# Patient Record
Sex: Male | Born: 1995
Health system: Southern US, Community
[De-identification: ages and names within clinical notes are randomized; demographics above are authoritative.]

## PROBLEM LIST (undated history)

## (undated) DIAGNOSIS — R519 Headache, unspecified: Secondary | ICD-10-CM

## (undated) DIAGNOSIS — R51 Headache: Secondary | ICD-10-CM

## (undated) DIAGNOSIS — K219 Gastro-esophageal reflux disease without esophagitis: Secondary | ICD-10-CM

---
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
## 2013-11-28 ENCOUNTER — Emergency Department: Payer: Self-pay | Admitting: Emergency Medicine

## 2014-03-03 ENCOUNTER — Emergency Department (HOSPITAL_COMMUNITY)
Admission: EM | Admit: 2014-03-03 | Discharge: 2014-03-04 | Disposition: A | Discharge status: Other Acute Inpatient Hospital | Payer: BC Managed Care – PPO | Attending: Emergency Medicine | Admitting: Emergency Medicine

## 2014-03-03 ENCOUNTER — Encounter (HOSPITAL_COMMUNITY): Payer: Self-pay | Admitting: Emergency Medicine

## 2014-03-03 DIAGNOSIS — R45850 Homicidal ideations: Secondary | ICD-10-CM | POA: Insufficient documentation

## 2014-03-03 DIAGNOSIS — R454 Irritability and anger: Secondary | ICD-10-CM

## 2014-03-03 DIAGNOSIS — F1210 Cannabis abuse, uncomplicated: Secondary | ICD-10-CM | POA: Insufficient documentation

## 2014-03-03 DIAGNOSIS — R45851 Suicidal ideations: Secondary | ICD-10-CM

## 2014-03-03 DIAGNOSIS — F911 Conduct disorder, childhood-onset type: Secondary | ICD-10-CM | POA: Insufficient documentation
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
## 2014-03-04 ENCOUNTER — Inpatient Hospital Stay (HOSPITAL_COMMUNITY): Admit: 2014-03-04 | Payer: Self-pay

## 2014-03-04 ENCOUNTER — Inpatient Hospital Stay (HOSPITAL_COMMUNITY)
Admission: AD | Admit: 2014-03-04 | Discharge: 2014-03-09 | DRG: 885 | Disposition: A | Discharge status: Home / Self Care | Payer: BC Managed Care – PPO | Source: Intra-hospital | Attending: Psychiatry | Admitting: Psychiatry

## 2014-03-04 ENCOUNTER — Emergency Department (HOSPITAL_COMMUNITY): Payer: BC Managed Care – PPO

## 2014-03-04 ENCOUNTER — Encounter (HOSPITAL_COMMUNITY): Payer: Self-pay | Admitting: Emergency Medicine

## 2014-03-04 ENCOUNTER — Encounter (HOSPITAL_COMMUNITY): Payer: Self-pay | Admitting: *Deleted

## 2014-03-04 DIAGNOSIS — F322 Major depressive disorder, single episode, severe without psychotic features: Secondary | ICD-10-CM

## 2014-03-04 DIAGNOSIS — R45850 Homicidal ideations: Secondary | ICD-10-CM

## 2014-03-04 DIAGNOSIS — F1994 Other psychoactive substance use, unspecified with psychoactive substance-induced mood disorder: Secondary | ICD-10-CM | POA: Diagnosis present

## 2014-03-04 DIAGNOSIS — F17200 Nicotine dependence, unspecified, uncomplicated: Secondary | ICD-10-CM | POA: Diagnosis present

## 2014-03-04 DIAGNOSIS — Z598 Other problems related to housing and economic circumstances: Secondary | ICD-10-CM

## 2014-03-04 DIAGNOSIS — F913 Oppositional defiant disorder: Secondary | ICD-10-CM | POA: Diagnosis present

## 2014-03-04 DIAGNOSIS — Z5987 Material hardship due to limited financial resources, not elsewhere classified: Secondary | ICD-10-CM

## 2014-03-04 DIAGNOSIS — F332 Major depressive disorder, recurrent severe without psychotic features: Principal | ICD-10-CM | POA: Diagnosis present

## 2014-03-04 DIAGNOSIS — F411 Generalized anxiety disorder: Secondary | ICD-10-CM | POA: Diagnosis present

## 2014-03-04 DIAGNOSIS — R45851 Suicidal ideations: Secondary | ICD-10-CM

## 2014-03-04 DIAGNOSIS — G4700 Insomnia, unspecified: Secondary | ICD-10-CM | POA: Diagnosis present

## 2014-03-04 LAB — CBC
HCT: 38.6 % — ABNORMAL LOW (ref 39.0–52.0)
Hemoglobin: 13.4 g/dL (ref 13.0–17.0)
MCH: 30.5 pg (ref 26.0–34.0)
MCHC: 34.7 g/dL (ref 30.0–36.0)
MCV: 87.7 fL (ref 78.0–100.0)
Platelets: 191 10*3/uL (ref 150–400)
RBC: 4.4 MIL/uL (ref 4.22–5.81)
RDW: 13.1 % (ref 11.5–15.5)
WBC: 7.3 10*3/uL (ref 4.0–10.5)

## 2014-03-04 LAB — RAPID URINE DRUG SCREEN, HOSP PERFORMED
AMPHETAMINES: NOT DETECTED
Barbiturates: NOT DETECTED
Benzodiazepines: NOT DETECTED
Cocaine: NOT DETECTED
OPIATES: NOT DETECTED
Tetrahydrocannabinol: POSITIVE — AB

## 2014-03-04 LAB — COMPREHENSIVE METABOLIC PANEL
ALT: 6 U/L (ref 0–53)
AST: 17 U/L (ref 0–37)
Albumin: 4.1 g/dL (ref 3.5–5.2)
Alkaline Phosphatase: 98 U/L (ref 39–117)
BUN: 8 mg/dL (ref 6–23)
CALCIUM: 9.2 mg/dL (ref 8.4–10.5)
CO2: 25 meq/L (ref 19–32)
Chloride: 104 mEq/L (ref 96–112)
Creatinine, Ser: 0.93 mg/dL (ref 0.50–1.35)
GFR calc Af Amer: >90 mL/min (ref >90)
Glucose, Bld: 95 mg/dL (ref 70–99)
Potassium: 4.1 mEq/L (ref 3.7–5.3)
Sodium: 141 mEq/L (ref 137–147)
Total Bilirubin: 0.4 mg/dL (ref 0.3–1.2)
Total Protein: 6.9 g/dL (ref 6.0–8.3)

## 2014-03-04 LAB — ETHANOL

## 2014-03-04 LAB — ACETAMINOPHEN LEVEL: Acetaminophen (Tylenol), Serum: <15 ug/mL (ref 10–30)

## 2014-03-04 LAB — SALICYLATE LEVEL: Salicylate Lvl: <2 mg/dL — ABNORMAL LOW (ref 2.8–20.0)

## 2014-03-04 MED ORDER — ZOLPIDEM TARTRATE 5 MG PO TABS: 5.0000 mg | ORAL_TABLET | Freq: Every evening | ORAL | Status: DC | PRN

## 2014-03-04 MED ORDER — LORAZEPAM 1 MG PO TABS: 1.0000 mg | ORAL_TABLET | Freq: Four times a day (QID) | ORAL | Status: DC | PRN

## 2014-03-04 MED ORDER — ACETAMINOPHEN 325 MG PO TABS: 650.0000 mg | ORAL_TABLET | ORAL | Status: DC | PRN

## 2014-03-04 MED ORDER — ONDANSETRON HCL 4 MG PO TABS: 4.0000 mg | ORAL_TABLET | Freq: Three times a day (TID) | ORAL | Status: DC | PRN

## 2014-03-04 MED ORDER — ACETAMINOPHEN 325 MG PO TABS: 650.0000 mg | ORAL_TABLET | Freq: Four times a day (QID) | ORAL | Status: DC | PRN

## 2014-03-04 MED ORDER — IBUPROFEN 400 MG PO TABS: 600.0000 mg | ORAL_TABLET | Freq: Three times a day (TID) | ORAL | Status: DC | PRN

## 2014-03-04 MED ORDER — HALOPERIDOL 1 MG PO TABS: 2.0000 mg | ORAL_TABLET | Freq: Four times a day (QID) | ORAL | Status: DC | PRN

## 2014-03-04 MED ORDER — MAGNESIUM HYDROXIDE 400 MG/5ML PO SUSP: 30.0000 mL | Freq: Every day | ORAL | Status: DC | PRN

## 2014-03-04 MED ORDER — LORAZEPAM 2 MG/ML IJ SOLN: 1.0000 mg | Freq: Four times a day (QID) | INTRAMUSCULAR | Status: DC | PRN

## 2014-03-04 MED ORDER — ALUM & MAG HYDROXIDE-SIMETH 200-200-20 MG/5ML PO SUSP: 30.0000 mL | ORAL | Status: DC | PRN

## 2014-03-04 MED ORDER — NICOTINE 21 MG/24HR TD PT24: 21.0000 mg | MEDICATED_PATCH | Freq: Every day | TRANSDERMAL | Status: DC

## 2014-03-04 MED ORDER — HYDROXYZINE HCL 25 MG PO TABS
25.0000 mg | ORAL_TABLET | Freq: Four times a day (QID) | ORAL | Status: DC | PRN
Start: 2014-03-04 — End: 2014-03-09
  Administered 2014-03-04 – 2014-03-08 (×5): 25 mg via ORAL
  Filled 2014-03-04 (×5): qty 1

## 2014-03-04 NOTE — ED Notes (Addendum)
Pt.'s mother spoke with Irving Burton, ACT Counselor.

## 2014-03-04 NOTE — Progress Notes (Signed)
Patient reports that he does not want to be here and patient did not want to stay in his room. Patient is in the quiet room laying down and reports that he can not be around people and he does not trust people. Patient was requesting a 72 hour discharge and when the paperwork was taken to him he did not sign and I informed him it will be on the front of his chart. Patient has been offered food and fluids and he did consume them. Charge RN Lupita Leash informed of patient status because he was alittle irritable but he was cooperating. PA Verne Spurr was also made ware of the status of the patient.

## 2014-03-04 NOTE — ED Notes (Signed)
Security called to wand patient 

## 2014-03-04 NOTE — ED Notes (Signed)
Pt requesting his left big toe to be examined because he dropped a skating boarding ramp on it. Security also at bedside to wand patient.

## 2014-03-04 NOTE — [facility] Group Notes (Signed)
[facility] LCSW Group Therapy      Emotional Regulation 1:15 - 2:30 PM            03/04/2014 2:48 PM  Type of Therapy:  Group Therapy  Participation Level:  Did Not Attend  Joesph July Mylin Hirano 03/04/2014, 2:48 PM

## 2014-03-04 NOTE — ED Notes (Signed)
Pt's mother here; visiting outside of normal hours approved by Interior and spatial designer. Security to wand.
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
## 2014-03-04 NOTE — Tx Team (Signed)
Initial Interdisciplinary Treatment Plan  PATIENT STRENGTHS: (choose at least two) Ability for insight Capable of independent living General fund of knowledge Physical Health  PATIENT STRESSORS: Marital or family conflict Substance abuse   PROBLEM LIST: Problem List/Patient Goals Date to be addressed Date deferred Reason deferred Estimated date of resolution  anxiety 03/04/2014     Suicidal ideations 03/04/2014                                                DISCHARGE CRITERIA:  Improved stabilization in mood, thinking, and/or behavior Need for constant or close observation no longer present Reduction of life-threatening or endangering symptoms to within safe limits  PRELIMINARY DISCHARGE PLAN: Outpatient therapy Placement in alternative living arrangements  PATIENT/FAMIILY INVOLVEMENT: This treatment plan has been presented to and reviewed with the patient, Khup Merriweather.  The patient and family have been given the opportunity to ask questions and make suggestions.  Barton Fanny 03/04/2014, 1:59 PM

## 2014-03-04 NOTE — BH Assessment (Signed)
[facility] Assessment Progress Note  Spoke with RN and set up tele assessment for 7:50 am.  Spoke with Dr. Gwendolyn Grant to inform him of assessment appt time and take history, but he did not see pt.

## 2014-03-04 NOTE — Progress Notes (Signed)
D: Patient in the hallway on approach.  Patient calm and cooperative with assessment.  Patient states he does not know what to expect by being here.  Patient states, "My mom put me here."  Patient became tearful when talking to Clinical research associate.  Patient denies SI/HI and denies AVH.    A: Staff to monitor Q 15 mins for safety.  Encouragement and support offered.  Scheduled medications administered per orders.  Vistaril admistered prn for sleep. R: Patient remains safe on the unit.  Patient attended group tonight.  Patient visible on the unit.  Patient taking administered medications.

## 2014-03-04 NOTE — Progress Notes (Signed)
Adult Psychoeducational Group Note  Date:  03/04/2014 Time:  10:56 PM  Group Topic/Focus:  Goals Group:   The focus of this group is to help patients establish daily goals to achieve during treatment and discuss how the patient can incorporate goal setting into their daily lives to aide in recovery.  Participation Level:  Active  Participation Quality:  Appropriate  Affect:  Appropriate  Cognitive:  Appropriate  Insight: Appropriate  Engagement in Group:  Engaged  Modes of Intervention:  Discussion  Additional Comments:  Pt stated that today was confusing but he wants to learn more about what's going on.  Aldona Lento 03/04/2014, 10:56 PM

## 2014-03-04 NOTE — ED Provider Notes (Signed)
Medical screening examination/treatment/procedure(s) were conducted as a shared visit with non-physician practitioner(s) and myself.  I personally evaluated the patient during the encounter.   EKG Interpretation None        Joya Gaskins, MD 03/04/14 [phone]

## 2014-03-04 NOTE — ED Notes (Signed)
Tele - psych being done by [facility].

## 2014-03-04 NOTE — ED Provider Notes (Signed)
Patient seen/examined in the Emergency Department in conjunction with Midlevel Provider [facility] Patient reports he is hopeless  With suicidal thoughts Exam : awake/alert, no distress noted Plan: awaiting telepsych evaluation    Joya Gaskins, MD 03/04/14 0345

## 2014-03-05 DIAGNOSIS — F329 Major depressive disorder, single episode, unspecified: Secondary | ICD-10-CM

## 2014-03-05 DIAGNOSIS — F1910 Other psychoactive substance abuse, uncomplicated: Secondary | ICD-10-CM

## 2014-03-05 MED ORDER — BUPROPION HCL ER (SR) 100 MG PO TB12
100.0000 mg | ORAL_TABLET | Freq: Every day | ORAL | Status: DC
  Administered 2014-03-05 – 2014-03-09 (×5): 100 mg via ORAL
  Filled 2014-03-05 (×7): qty 1
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
## 2014-03-05 NOTE — Progress Notes (Addendum)
D:  Patient's self inventory sheet, patient has fair sleep, good appetite, low energy level, poor attention span.  Rated depression 4, hopeless 1, anxiety 5.  Denied withdrawals.  Denied SI.  Denied physical problems.  Patient plans to discharge to mother's house.  No problems with meds after discharge, has insurance. A:  Medications administered per MD orders.  Emotional support and encouragement given patient. R:  Denied SI and HI, contracts for safety.  Denied A/V hallucinations.  Will continue to monitor patient for safety with 15 minute checks.  Safety maintained.  Parents want birthday cake to be given at 5:30 p.m. Tomorrow Friday.
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
## 2014-03-05 NOTE — Progress Notes (Signed)
Recreation Therapy Notes  Animal-Assisted Activity/Therapy (AAA/T) Program Checklist/Progress Notes Patient Eligibility Criteria Checklist & Daily Group note for Rec Tx Intervention  Date: 06.04.2015 Time: 2:45pm Location: [address] Dayroom    AAA/T Program Assumption of Risk Form signed by Patient/ or Parent Legal Guardian yes  Patient is free of allergies or sever asthma yes  Patient reports no fear of animals yes  Patient reports no history of cruelty to animals yes   Patient understands his/her participation is voluntary yes  Patient washes hands before animal contact yes  Patient washes hands after animal contact yes  Behavioral Response: Appropriate   Education: Hand Washing, Appropriate Animal Interaction   Education Outcome: Acknowledges understanding   Clinical Observations/Feedback: Patient interacted appropriately with pet therapy team and peers.  Hisayo Delossantos L Dyon Rotert, LRT/CTRS        Fernando Torry L Safa Derner 03/05/2014 5:08 PM 

## 2014-03-05 NOTE — Progress Notes (Signed)
The focus of this group is to educate the patient on the purpose and policies of crisis stabilization and provide a format to answer questions about their admission.  The group details unit policies and expectations of patients while admitted.  Patient did not attend 0900 nurse education orientation group this morning.  Patient stayed in bed sleeping.    

## 2014-03-05 NOTE — [facility] Group Notes (Signed)
[facility] LCSW Group Therapy  [facility] 1:15 - 2:30 PM  03/05/2014   Type of Therapy:  Group Therapy  Participation Level: Minimal  Participation Quality:  Attentive  Affect:  Appropriate  Cognitive:  Appropriate  Insight:  Developing/Improving   Engagement in Therapy:  Developing/Improving  Modes of Intervention:  Discussion, Education, Exploration, Problem-Solving, Rapport Building, Support   Summary of Progress/Problems:  Patient listened attentively to speaker from [facility] but made no comment on the presentation..   Sherriann Szuch Hairston 03/05/2014     

## 2014-03-05 NOTE — [facility] Suicide Risk Assessment (Signed)
[facility] INPATIENT:  Family/Significant Other Suicide Prevention Education  Suicide Prevention Education:  Education Completed; Anibal Peal, Mother, [phone];  has been identified by the patient as the family member/significant other with whom the patient will be residing, and identified as the person(s) who will aid the patient in the event of a mental health crisis (suicidal ideations/suicide attempt).  With written consent from the patient, the family member/significant other has been provided the following suicide prevention education, prior to the and/or following the discharge of the patient.  The suicide prevention education provided includes the following:  Suicide risk factors  Suicide prevention and interventions  National Suicide Hotline telephone number  [facility] assessment telephone number  [facility] Emergency Assistance 911  [facility] and/or Residential Mobile Crisis Unit telephone number  Request made of family/significant other to:  Remove weapons (e.g., guns, rifles, knives), all items previously/currently identified as safety concern. Mother advised guns will be secured prior to patient discharging.    Remove drugs/medications (over-the-counter, prescriptions, illicit drugs), all items previously/currently identified as a safety concern.  The family member/significant other verbalizes understanding of the suicide prevention education information provided.  The family member/significant other agrees to remove the items of safety concern listed above.  Chayanne Filippi Hairston Lauran Romanski 03/05/2014, 3:50 PM

## 2014-03-05 NOTE — Progress Notes (Signed)
Pt complained about sweating palms and feeling anxious when in [place].

## 2014-03-05 NOTE — [facility] Suicide Risk Assessment (Signed)
Suicide Risk Assessment  Admission Assessment     Nursing information obtained from:  Patient Demographic factors:  Male;Adolescent or young adult;Caucasian;Low socioeconomic status Current Mental Status:  Self-harm thoughts Loss Factors:  Loss of significant relationship Historical Factors:  Impulsivity Risk Reduction Factors:  Sense of responsibility to family Total Time spent with patient: 45 minutes  CLINICAL FACTORS:   Depression:   Aggression Anhedonia Hopelessness Impulsivity Insomnia Recent sense of peace/wellbeing Severe Unstable or Poor Therapeutic Relationship Previous Psychiatric Diagnoses and Treatments  Psychiatric Specialty Exam:     Blood pressure 122/76, pulse 45, temperature 97.3 F (36.3 C), temperature source Oral, resp. rate 16, height 5\' 9"  (1.753 m), weight 66.225 kg (146 lb).Body mass index is 21.55 kg/(m^2).  General Appearance: Guarded  Eye Contact::  Good  Speech:  Clear and Coherent  Volume:  Decreased  Mood:  Anxious, Depressed and Irritable  Affect:  Depressed and Flat  Thought Process:  Coherent and Goal Directed  Orientation:  Full (Time, Place, and Person)  Thought Content:  Rumination  Suicidal Thoughts:  Yes.  without intent/plan  Homicidal Thoughts:  No  Memory:  Immediate;   Good  Judgement:  Good  Insight:  Fair  Psychomotor Activity:  Decreased  Concentration:  Fair  Recall:  Good  Fund of Knowledge:Good  Language: Good  Akathisia:  NA  Handed:  Right  AIMS (if indicated):     Assets:  Communication Skills Desire for Improvement Intimacy Leisure Time Physical Health Resilience Social Support Talents/Skills Transportation  Sleep:  Number of Hours: 6.75   Musculoskeletal: Strength & Muscle Tone: within normal limits Gait & Station: normal Patient leans: N/A  COGNITIVE FEATURES THAT CONTRIBUTE TO RISK:  Polarized thinking    SUICIDE RISK:   Moderate:  Frequent suicidal ideation with limited intensity, and  duration, some specificity in terms of plans, no associated intent, good self-control, limited dysphoria/symptomatology, some risk factors present, and identifiable protective factors, including available and accessible social support.  PLAN OF CARE: Admit for depression and suicidal ideations.  I certify that inpatient services furnished can reasonably be expected to improve the patient's condition.  Randal Buba Timmya Blazier 03/05/2014, 1:37 PM

## 2014-03-05 NOTE — Progress Notes (Signed)
D: Pt denies SI/HI/AVH. Pt is pleasant and cooperative. Pt says he's getting better. Pt stated he wanted to get his GED, and then go to community college. Pt appears to have a understanding on what he needs to do. Pt stated he will change the way he associates with his friends, and possibly cut them loose if need be.   A: Pt was offered support and encouragement. Pt was given scheduled medications. Pt was encourage to attend groups. Q 15 minute checks were done for safety.   R:Pt attends groups and interacts well with peers and staff. Pt is taking medication. Pt has no complaints at this time.Pt receptive to treatment and safety maintained on unit.

## 2014-03-06 DIAGNOSIS — F913 Oppositional defiant disorder: Secondary | ICD-10-CM

## 2014-03-06 DIAGNOSIS — R45850 Homicidal ideations: Secondary | ICD-10-CM

## 2014-03-06 NOTE — Progress Notes (Signed)
Attended Group

## 2014-03-06 NOTE — Tx Team (Addendum)
Interdisciplinary Treatment Plan Update   Date Reviewed:  03/09/2014  Time Reviewed:  12:41 PM  Progress in Treatment:   Attending groups: Yes Participating in groups: Yes Taking medication as prescribed: Yes  Tolerating medication: Yes Family/Significant other contact made:  Yes, collateral contact with mother. Patient understands diagnosis: Yes  Discussing patient identified problems/goals with staff: Yes Medical problems stabilized or resolved: Yes Denies suicidal/homicidal ideation: Yes Patient has not harmed self or others: Yes  For review of initial/current patient goals, please see plan of care.  Estimated Length of Stay:  Discharge today  Reasons for Continued Hospitalization:    New Problems/Goals identified:    Discharge Plan or Barriers:   Home with outpatient follow up with [facility] and [facility]  Additional Comments:    Attendees:  Patient:  03/09/2014 12:41 PM   Signature:  Sallyanne Havers, MD 03/09/2014 12:41 PM  Signature:   03/09/2014 12:41 PM  Signature: Lamount Cranker, RN 03/09/2014 12:41 PM  Signature: Quintella Reichert, RN 03/09/2014 12:41 PM  Signature:  Lysbeth Penner, RN 03/09/2014 12:41 PM  Signature:  Juline Patch, LCSW 03/09/2014 12:41 PM  Signature:  Reyes Ivan, LCSW 03/09/2014 12:41 PM  Signature:  Leisa Lenz, Care Coordinator [facility] 03/09/2014 12:41 PM  Signature:   03/09/2014 12:41 PM  Signature: 03/09/2014  12:41 PM  Signature:   Onnie Boer, RN URCM 03/09/2014  12:41 PM  Signature: 03/09/2014  12:41 PM    Scribe for Treatment Team:   Juline Patch,  03/09/2014 12:41 PM

## 2014-03-06 NOTE — BHH Group Notes (Signed)
[facility] LCSW Aftercare Discharge Planning Group Note   03/06/2014 9:48 AM    Participation Quality:  Appropraite  Mood/Affect:  Appropriate  Depression Rating:  0  Anxiety Rating:  0  Thoughts of Suicide:  No  Will you contract for safety?   NA  Current AVH:  No  Plan for Discharge/Comments:  Patient attended discharge planning group and actively participated in group.  He will follow up with [facility] and Glendell Docker.  CSW provided all participants with daily workbook.   Transportation Means: Patient has transportation.   Supports:  Patient has a support system.   Omya Winfield, Joesph July

## 2014-03-06 NOTE — Progress Notes (Signed)
Gary Washington is OOB UAL on the 500 hall today..he tolerates this  Well.   A HE is cooperative, pleasant and easy to get along with. HE takes his emdications as ordered and he attends his groups as planned. He completes his morning self inventory and on it he writes he denies SI within the past 24 hrs, he rates his depression and hopelessness "0/0", respectively and says his DC plans are to " quit smoking and make more friends".   R Safety is in place and poc moves forward.

## 2014-03-06 NOTE — Progress Notes (Signed)
NUTRITION ASSESSMENT  Pt identified as at risk on the Malnutrition Screen Tool  INTERVENTION: 1. Educated patient on the importance of nutrition and encouraged intake of food and beverages.  Provided handout on healthy meal planning.  Recommended [place] of [employer].  Discussed IBT program at local farmer's market. 2. Discussed weight goals. 3. Supplements: none at this time.  NUTRITION DIAGNOSIS: Unintentional weight loss related to sub-optimal intake as evidenced by pt report.   Goal: Pt to meet >/= 90% of their estimated nutrition needs.  Monitor:  PO intake  Assessment:  Patient admitted with Major depression disorder, Oppositional defiant disorder, marijuana abuse.  Patient reports that he currently has an "OK" appetite with a decreased intake currently and a low intake for the past year secondary to being "lazy".    "I'm really not depressed but about 2-3 hours a day."  "just if something happens that bothers me"  "Sometimes I go months without feeling depression."  Patient states that he know he needs to eat better and plans on moving in with his mother and getting food stamps after d/c.  States he used to drink [employer] every morning.    18 y.o. male  Height: Ht Readings from Last 1 Encounters:  03/04/14 5\' 9"  (1.753 m) (42%*, Z = -0.21)   * Growth percentiles are based on CDC 2-20 Years data.    Weight: Wt Readings from Last 1 Encounters:  03/04/14 146 lb (66.225 kg) (35%*, Z = -0.38)   * Growth percentiles are based on CDC 2-20 Years data.    Weight Hx: Wt Readings from Last 10 Encounters:  03/04/14 146 lb (66.225 kg) (35%*, Z = -0.38)  03/03/14 147 lb 1 oz (66.707 kg) (37%*, Z = -0.33)   * Growth percentiles are based on CDC 2-20 Years data.    BMI:  Body mass index is 21.55 kg/(m^2). Pt meets criteria for normal weight based on current BMI.  Estimated Nutritional Needs: Kcal: 25-30 kcal/kg Protein: > 1 gram  protein/kg Fluid: 1 ml/kcal  Diet Order: General Pt is also offered choice of unit snacks mid-morning and mid-afternoon.  Pt is eating as desired.   Lab results and medications reviewed.   Oran Rein, RD, LDN Clinical Inpatient Dietitian Pager:  [phone] Weekend and after hours pager:  [phone]

## 2014-03-06 NOTE — Progress Notes (Signed)
[facility]  03/06/2014 12:42 PM Lynelle DoctorBrandon Highland  MRN:  [id number] Subjective: Patient continued to be depressed, anxious, hopeless, helpless, worthless, angry, impulsive and has a poor decision-making. Patient has low self-esteem and has a poor relationship with both mother and father. Patient also has a no good relationship with his boss will be worked up recently. Patient is known to have significant symptoms of anger outbursts and property destruction. Patient has been compliant with his medication and has not reported side effects. Patient has been learning coping skills by attending inpatient therapeutic activities.  Diagnosis:   DSM5: Schizophrenia Disorders:   Obsessive-Compulsive Disorders:   Trauma-Stressor Disorders:   Substance/Addictive Disorders:   Depressive Disorders:  Major Depressive Disorder - Severe (296.23) Total Time spent with patient: 30 minutes  Axis I: Major Depression, Recurrent severe and Oppositional Defiant Disorder  ADL's:  Impaired  Sleep: Fair  Appetite:  Fair  Suicidal Ideation:  Patient endorses suicidal ideation but contracts for safety while in the hospital Homicidal Ideation:  Patient stated he was upset with his mother and had a thoughtabout hurting her not homicidal thoughts AEB (as evidenced by):  Psychiatric Specialty Exam: Physical Exam  ROS  Blood pressure 121/81, pulse 42, temperature 97.3 F (36.3 C), temperature source Oral, resp. rate 16, height 5\' 9"  (1.753 m), weight 66.225 kg (146 lb).Body mass index is 21.55 kg/(m^2).  General Appearance: Guarded  Eye Contact::  Minimal  Speech:  Clear and Coherent and Slow  Volume:  Decreased  Mood:  Anxious, Depressed, Hopeless and Worthless  Affect:  Depressed and Flat  Thought Process:  Goal Directed and Intact  Orientation:  Full (Time, Place, and Person)  Thought Content:  Rumination  Suicidal Thoughts:  Yes.  with intent/plan  Homicidal Thoughts:  Yes.  without intent/plan   Memory:  Immediate;   Fair  Judgement:  Impaired  Insight:  Lacking  Psychomotor Activity:  Decreased  Concentration:  Fair  Recall:  [employer]  Fund of Knowledge:Fair  Language: Good  Akathisia:  NA  Handed:  Right  AIMS (if indicated):     Assets:  Communication Skills Desire for Improvement Financial Resources/Insurance Leisure Time Physical Health Resilience Social Support Talents/Skills  Sleep:  Number of Hours: 5.25   Musculoskeletal: Strength & Muscle Tone: within normal limits Gait & Station: normal Patient leans: N/A  Current Medications: Current Facility-Administered Medications  Medication Dose Route Frequency Provider Last Rate Last Dose  . acetaminophen (TYLENOL) tablet 650 mg  650 mg Oral Q6H PRN Beau FannyJohn C Withrow, FNP      . alum & mag hydroxide-simeth (MAALOX/MYLANTA) 200-200-20 MG/5ML suspension 30 mL  30 mL Oral Q4H PRN Beau FannyJohn C Withrow, FNP      . buPROPion [facility](WELLBUTRIN SR) 12 hr tablet 100 mg  100 mg Oral Daily Nehemiah SettleJanardhaha R Siham Bucaro, MD   100 mg at 03/06/14 1149  . hydrOXYzine (ATARAX/VISTARIL) tablet 25 mg  25 mg Oral Q6H PRN Beau FannyJohn C Withrow, FNP   25 mg at 03/05/14 2252  . LORazepam (ATIVAN) tablet 1 mg  1 mg Oral Q6H PRN Verne SpurrNeil Mashburn, PA-C       Or  . LORazepam (ATIVAN) injection 1 mg  1 mg Intramuscular Q6H PRN Verne SpurrNeil Mashburn, PA-C      . magnesium hydroxide (MILK OF MAGNESIA) suspension 30 mL  30 mL Oral Daily PRN Beau FannyJohn C Withrow, FNP        Lab Results: No results found for this or any previous visit (from the past 48 hour(s)).  Physical Findings: AIMS: Facial and Oral Movements Muscles of Facial Expression: None, normal Lips and Perioral Area: None, normal Jaw: None, normal Tongue: None, normal,Extremity Movements Upper (arms, wrists, hands, fingers): None, normal Lower (legs, knees, ankles, toes): None, normal, Trunk Movements Neck, shoulders, hips: None, normal, Overall Severity Severity of abnormal movements (highest score from questions  above): None, normal Incapacitation due to abnormal movements: None, normal Patient's awareness of abnormal movements (rate only patient's report): No Awareness, Dental Status Current problems with teeth and/or dentures?: No Does patient usually wear dentures?: No  CIWA:  CIWA-Ar Total: 1 COWS:  COWS Total Score: 2  Treatment Plan Summary: Daily contact with patient to assess and evaluate symptoms and progress in treatment Medication management  Plan: Treatment Plan/Recommendations:   1. Admit for crisis management and stabilization. 2. Medication management to reduce current symptoms to base line and improve the patient's overall level of functioning. Continue Wellbutrin SR 100 mg daily which can be titrated up to 300 mg as needed medically 3. Treat health problems as indicated. 4. Develop treatment plan to decrease risk of relapse upon discharge and to reduce the need for readmission. 5. Psycho-social education regarding relapse prevention and self care. 6. Health care follow up as needed for medical problems. 7. Restart home medications where appropriate. 8. disposition plans are in progress and patient may be discharged on Monday when he showed clinical improvement and contracts for safety.   Medical Decision Making Problem Points:  Established problem, worsening (2), New problem, with no additional work-up planned (3), Review of last therapy session (1) and Review of psycho-social stressors (1) Data Points:  Review or order clinical lab tests (1) Review or order medicine tests (1) Review of medication regiment & side effects (2) Review of new medications or change in dosage (2)  I certify that inpatient services furnished can reasonably be expected to improve the patient's condition.   Randal Buba Clee Pandit 03/06/2014, 12:42 PM

## 2014-03-07 DIAGNOSIS — F322 Major depressive disorder, single episode, severe without psychotic features: Secondary | ICD-10-CM

## 2014-03-07 DIAGNOSIS — R45851 Suicidal ideations: Secondary | ICD-10-CM

## 2014-03-07 NOTE — B[facility] Group Notes (Signed)
0900 nursing orientation group   The focus of this group is to educate the patient on the purpose and policies of crisis stabilization and provide a format to answer questions about their admission.  The group details unit policies and expectations of patients while admitted.  Pt was an active participant and appropriate and sharing.  He voiced no complaints.

## 2014-03-07 NOTE — [facility] Group Notes (Signed)
[facility] Group Notes:  (Clinical Social Work)  03/07/2014   3-4PM  Summary of Progress/Problems:   The main focus of today's process group was for the patient to identify ways in which they have sabotaged their own mental health wellness/recovery.  Motivational interviewing and a handout were used to explore the benefits and costs of their self-sabotaging behavior as well as the benefits and costs of changing this behavior.  The Stages of Change were explained to the group using a handout, and patients identified where they are with regard to changing self-defeating behaviors.  The patient was present for the first and last parts of group and was very vocal and engaged.  He was not present in the middle when people were sharing specifics about themselves.  He gave others good feedback.  Type of Therapy:  Process Group  Participation Level:  Active  Participation Quality:  Attentive and Supportive  Affect:  Blunted  Cognitive:  Alert and Oriented  Insight:  Engaged  Engagement in Therapy:  Engaged  Modes of Intervention:  Education, Motivational Interviewing   [employer], LCSW 03/07/2014, 4:00pm

## 2014-03-07 NOTE — Progress Notes (Signed)
Psychoeducational Group Note  Date: 03/07/2014 Time:  1015  Group Topic/Focus:  Identifying Needs:   The focus of this group is to help patients identify their personal needs that have been historically problematic and identify healthy behaviors to address their needs.  Participation Level:  Active  Participation Quality:  Appropriate  Affect:  Appropriate  Cognitive:  Oriented  Insight:  Improving  Engagement in Group:  Engaged  Additional Comments:  Pt was attentive and participated in the group. Shared good ideas.  Karinna Beadles A  

## 2014-03-07 NOTE — Progress Notes (Signed)
Pt has been up and active in the milieu today. He did shower this morning after breakfast.  He denies any depression or hopelessness but has anxiety a 2 on his self-inventory.  He denies any S/H ideation or A/V/H/. He reports his sleep is well and appetite improving and ability to pay attention as improving. He denies any physical problems.

## 2014-03-07 NOTE — Progress Notes (Signed)
Patient ID: Gary Washington, male   DOB: 05-01-1996, 18 y.o.   MRN: [id number] [facility] MD Progress Note  03/07/2014 3:32 PM Gary Washington  MRN:  [id number] Subjective: Patient is up and active in groups on the unit. He states he has hope now, has a different mind set, he reports that he now has some tools to use to help him cope with his anger. He is optimistic and pleasant. He states he is tired of being and feeling slow and states he plans to stop smoking weed now. He seems like an new person.  Objective: Master is alert and oriented today. He is focused and interested in what is going on around him. He is much less edgy and irritated. He is making plans to get his GED and thinks he may sit for the test to see if he can pass it out right.  Then if he fails he will take the class. If he passes he will think about college.  Diagnosis:   DSM5: Schizophrenia Disorders:   Obsessive-Compulsive Disorders:   Trauma-Stressor Disorders:   Substance/Addictive Disorders:   Depressive Disorders:  Major Depressive Disorder - Severe (296.23) Total Time spent with patient: 30 minutes  Axis I: Major Depression, Recurrent severe and Oppositional Defiant Disorder  ADL's:  Impaired  Sleep: Fair  Appetite:  Fair  Suicidal Ideation:  Patient endorses suicidal ideation but contracts for safety while in the hospital Homicidal Ideation:  Patient stated he was upset with his mother and had a thoughtabout hurting her not homicidal thoughts AEB (as evidenced by):  Psychiatric Specialty Exam: Physical Exam  ROS  Blood pressure 110/74, pulse 51, temperature 97.3 F (36.3 C), temperature source Oral, resp. rate 16, height 5\' 9"  (1.753 m), weight 66.225 kg (146 lb).Body mass index is 21.55 kg/(m^2).  General Appearance: Guarded  Eye Contact::  Minimal  Speech:  Clear and Coherent and Slow  Volume:  Decreased  Mood:  Anxious, Depressed, Hopeless and Worthless  Affect:  Depressed and Flat  Thought Process:  Goal  Directed and Intact  Orientation:  Full (Time, Place, and Person)  Thought Content:  Rumination  Suicidal Thoughts:  Yes.  with intent/plan  Homicidal Thoughts:  Yes.  without intent/plan  Memory:  Immediate;   Fair  Judgement:  Impaired  Insight:  Lacking  Psychomotor Activity:  Decreased  Concentration:  Fair  Recall:  [employer] of Knowledge:Fair  Language: Good  Akathisia:  NA  Handed:  Right  AIMS (if indicated):     Assets:  Communication Skills Desire for Improvement Financial Resources/Insurance Leisure Time Physical Health Resilience Social Support Talents/Skills  Sleep:  Number of Hours: 6   Musculoskeletal: Strength & Muscle Tone: within normal limits Gait & Station: normal Patient leans: N/A  Current Medications: Current Facility-Administered Medications  Medication Dose Route Frequency Provider Last Rate Last Dose  . acetaminophen (TYLENOL) tablet 650 mg  650 mg Oral Q6H PRN Beau Fanny, FNP      . alum & mag hydroxide-simeth (MAALOX/MYLANTA) 200-200-20 MG/5ML suspension 30 mL  30 mL Oral Q4H PRN Beau Fanny, FNP      . buPROPion [facility] SR) 12 hr tablet 100 mg  100 mg Oral Daily Nehemiah Settle, MD   100 mg at 03/07/14 0852  . hydrOXYzine (ATARAX/VISTARIL) tablet 25 mg  25 mg Oral Q6H PRN Beau Fanny, FNP   25 mg at 03/06/14 2122  . LORazepam (ATIVAN) tablet 1 mg  1 mg Oral Q6H PRN Verne Spurr,  PA-C       Or  . LORazepam (ATIVAN) injection 1 mg  1 mg Intramuscular Q6H PRN Verne SpurrNeil Terryn Redner, PA-C      . magnesium hydroxide (MILK OF MAGNESIA) suspension 30 mL  30 mL Oral Daily PRN Beau FannyJohn C Withrow, FNP        Lab Results: No results found for this or any previous visit (from the past 48 hour(s)).  Physical Findings: AIMS: Facial and Oral Movements Muscles of Facial Expression: None, normal Lips and Perioral Area: None, normal Jaw: None, normal Tongue: None, normal,Extremity Movements Upper (arms, wrists, hands, fingers): None,  normal Lower (legs, knees, ankles, toes): None, normal, Trunk Movements Neck, shoulders, hips: None, normal, Overall Severity Severity of abnormal movements (highest score from questions above): None, normal Incapacitation due to abnormal movements: None, normal Patient's awareness of abnormal movements (rate only patient's report): No Awareness, Dental Status Current problems with teeth and/or dentures?: No Does patient usually wear dentures?: No  CIWA:  CIWA-Ar Total: 1 COWS:  COWS Total Score: 2  Treatment Plan Summary: Daily contact with patient to assess and evaluate symptoms and progress in treatment Medication management  Plan: Treatment Plan/Recommendations:   1.Continue crisis management and stabilization. 2. Medication management to reduce current symptoms to base line and improve the patient's overall level of functioning. Continue Wellbutrin SR 100 mg daily which can be titrated up to 300 mg as needed medically 3. Treat health problems as indicated. 4. Develop treatment plan to decrease risk of relapse upon discharge and to reduce the need for readmission. 5. Psycho-social education regarding relapse prevention and self care. 6. Health care follow up as needed for medical problems. 7. Restart home medications where appropriate. 8. disposition plans are in progress and patient may be discharged on Monday when he showed clinical improvement and contracts for safety. 9. Anticipating D/C on Monday.  Medical Decision Making Problem Points:  Established problem, worsening (2), New problem, with no additional work-up planned (3), Review of last therapy session (1) and Review of psycho-social stressors (1) Data Points:  Review or order clinical lab tests (1) Review or order medicine tests (1) Review of medication regiment & side effects (2) Review of new medications or change in dosage (2)  I certify that inpatient services furnished can reasonably be expected to improve the  patient's condition.  Rona RavensNeil T. Rithy Mandley RPAC 3:56 PM 03/07/2014

## 2014-03-07 NOTE — Progress Notes (Signed)
Adult Psychoeducational Group Note  Date:  03/07/2014 Time:  11:38 PM  Group Topic/Focus:  Wrap-Up Group:   The focus of this group is to help patients review their daily goal of treatment and discuss progress on daily workbooks.  Participation Level:  Active  Participation Quality:  Appropriate  Affect:  Appropriate  Cognitive:  Appropriate  Insight: Appropriate  Engagement in Group:  Engaged  Modes of Intervention:  Discussion  Additional Comments:  The patient expressed  that he had a happy day.The patient said that he was able to laugh and joke today.  Laneta Simmers Laquinda Moller 03/07/2014, 11:38 PM

## 2014-03-07 NOTE — Progress Notes (Signed)
D: Pt denies SI/HI/AVH. Pt is pleasant and cooperative. Pt stated he learned a lot about himself , more here than any other place.   A: Pt was offered support and encouragement. Pt was given scheduled medications. Pt was encourage to attend groups. Q 15 minute checks were done for safety.   R:Pt attends groups and interacts well with peers and staff. Pt is taking medication. Pt has no complaints at this time.Pt receptive to treatment and safety maintained on unit.

## 2014-03-08 NOTE — B[facility] Group Notes (Signed)
[facility] Group Notes:  (Clinical Social Work)  03/08/2014   1:15-2:15PM  Summary of Progress/Problems:  The main focus of today's process group was to   identify the patient's current support system and decide on other supports that can be put in place.  The picture on workbook was used to discuss why additional supports are needed.  An emphasis was placed on using counselor, doctor, therapy groups, 12-step groups, and problem-specific support groups to expand supports.   There was also an extensive discussion about what constitutes a healthy support versus an unhealthy support.  The patient expressed full comprehension of the concepts presented, and agreed that there is a need to add more supports.  The patient that he does not really like groups because he has ADD and it is very hard for him to stay focused.  CSW urged him to stand and sit quietly as needed to ease this problem.  He did not stand any during group, and had good eye contact and focus throughout.  Type of Therapy:  Process Group  Participation Level:  Active  Participation Quality:  Attentive and Sharing  Affect:  Blunted  Cognitive:  Appropriate and Oriented  Insight:  Engaged  Engagement in Therapy:  Engaged  Modes of Intervention:  Education,  Support and [employer], LCSW 03/08/2014, 4:00pm

## 2014-03-08 NOTE — Progress Notes (Signed)
Psychoeducational Group Note  Date: 03/08/2014 Time: 1015  Group Topic/Focus:  Making Healthy Choices:   The focus of this group is to help patients identify negative/unhealthy choices they were using prior to admission and identify positive/healthier coping strategies to replace them upon discharge.  Participation Level:  Active  Participation Quality:  Appropriate  Affect:  Appropriate  Cognitive:  Oriented  Insight:  Engaged  Engagement in Group:  Engaged  Additional Comments:    03/08/2014,4:56 PM Joie Bimler Muriel Hannold

## 2014-03-08 NOTE — Progress Notes (Signed)
Patient ID: Gary Washington, male   DOB: [birth year], 18 y.o.   MRN: [id number] D: Pt. Lying in bed, eyes closed, respirations even. A: Writer observed for s/s of distress. Staff will monitor q37min for safety. R: Pt. Is safe on the unit, no distress noted, respirations even, unlabored.

## 2014-03-08 NOTE — Progress Notes (Signed)
Pt has been up and active in the milieu today.  He rated his depression, hopelessness and anxiety a 1 on his self-inventory.  He denies any S/H ideation or A/V/H.  Voiced no complaints thus far.  Plans to f/u with Dr. Tomasa Rand.

## 2014-03-08 NOTE — B[facility] Group Notes (Signed)
0900 nursing orientation group  The focus of this group is to educate the patient on the purpose and policies of crisis stabilization and provide a format to answer questions about their admission.  The group details unit policies and expectations of patients while admitted.   Pt was appropriate and sharing in group.  His mother is the "one person I can count on" "I need to give myself credit for the things I do"

## 2014-03-08 NOTE — Progress Notes (Signed)
Patient ID: Gary Washington, male   DOB: Jun 20, 1996, 18 y.o.   MRN: [id number] [facility] MD Progress Note  03/08/2014 5:13 PM Gary Washington  MRN:  [id number] Subjective: Continues to do well today. Mood continues to improve, patient is doing well and has no complaints.  He is anticipating discharge to home in the AM as Dr. Shela Commons "promised him."  He denies SIHI/ or AVH. He is taking his meds and attending groups. He is doing well. Objective: Gary Washington is alert and oriented today. He is focused and interested in what is going on around him. He is much less edgy and irritated. He is making plans to get his GED and thinks he may sit for the test to see if he can pass it out right.  Then if he fails he will take the class. If he passes he will think about college.  Diagnosis:   DSM5: Schizophrenia Disorders:   Obsessive-Compulsive Disorders:   Trauma-Stressor Disorders:   Substance/Addictive Disorders:   Depressive Disorders:  Major Depressive Disorder - Severe (296.23) Total Time spent with patient: 30 minutes  Axis I: Major Depression, Recurrent severe and Oppositional Defiant Disorder  ADL's:  Impaired  Sleep: Fair  Appetite:  Fair  Suicidal Ideation:  Patient endorses suicidal ideation but contracts for safety while in the hospital Homicidal Ideation:  Patient stated he was upset with his mother and had a thoughtabout hurting her not homicidal thoughts AEB (as evidenced by):  Psychiatric Specialty Exam: Physical Exam  ROS  Blood pressure 117/69, pulse 54, temperature 97.3 F (36.3 C), temperature source Oral, resp. rate 17, height 5\' 9"  (1.753 m), weight 66.225 kg (146 lb).Body mass index is 21.55 kg/(m^2).  General Appearance: Guarded  Eye Contact::  Minimal  Speech:  Clear and Coherent and Slow  Volume:  Decreased  Mood:  Anxious, Depressed, Hopeless and Worthless  Affect:  Depressed and Flat  Thought Process:  Goal Directed and Intact  Orientation:  Full (Time, Place, and Person)  Thought  Content:  Rumination  Suicidal Thoughts:  Yes.  with intent/plan  Homicidal Thoughts:  Yes.  without intent/plan  Memory:  Immediate;   Fair  Judgement:  Impaired  Insight:  Lacking  Psychomotor Activity:  Decreased  Concentration:  Fair  Recall:  [employer] of Knowledge:Fair  Language: Good  Akathisia:  NA  Handed:  Right  AIMS (if indicated):     Assets:  Communication Skills Desire for Improvement Financial Resources/Insurance Leisure Time Physical Health Resilience Social Support Talents/Skills  Sleep:  Number of Hours: 4.5   Musculoskeletal: Strength & Muscle Tone: within normal limits Gait & Station: normal Patient leans: N/A  Current Medications: Current Facility-Administered Medications  Medication Dose Route Frequency Provider Last Rate Last Dose  . acetaminophen (TYLENOL) tablet 650 mg  650 mg Oral Q6H PRN Beau Fanny, FNP      . alum & mag hydroxide-simeth (MAALOX/MYLANTA) 200-200-20 MG/5ML suspension 30 mL  30 mL Oral Q4H PRN Beau Fanny, FNP      . buPROPion [facility] SR) 12 hr tablet 100 mg  100 mg Oral Daily Nehemiah Settle, MD   100 mg at 03/07/14 0852  . hydrOXYzine (ATARAX/VISTARIL) tablet 25 mg  25 mg Oral Q6H PRN Beau Fanny, FNP   25 mg at 03/06/14 2122  . LORazepam (ATIVAN) tablet 1 mg  1 mg Oral Q6H PRN Verne Spurr, PA-C       Or  . LORazepam (ATIVAN) injection 1 mg  1 mg Intramuscular  Q6H PRN Verne SpurrNeil Ilithyia Titzer, PA-C      . magnesium hydroxide (MILK OF MAGNESIA) suspension 30 mL  30 mL Oral Daily PRN Beau FannyJohn C Withrow, FNP        Lab Results: No results found for this or any previous visit (from the past 48 hour(s)).  Physical Findings: AIMS: Facial and Oral Movements Muscles of Facial Expression: None, normal Lips and Perioral Area: None, normal Jaw: None, normal Tongue: None, normal,Extremity Movements Upper (arms, wrists, hands, fingers): None, normal Lower (legs, knees, ankles, toes): None, normal, Trunk Movements Neck,  shoulders, hips: None, normal, Overall Severity Severity of abnormal movements (highest score from questions above): None, normal Incapacitation due to abnormal movements: None, normal Patient's awareness of abnormal movements (rate only patient's report): No Awareness, Dental Status Current problems with teeth and/or dentures?: No Does patient usually wear dentures?: No  CIWA:  CIWA-Ar Total: 1 COWS:  COWS Total Score: 2  Treatment Plan Summary: Daily contact with patient to assess and evaluate symptoms and progress in treatment Medication management  Plan: Treatment Plan/Recommendations:   1.Continue crisis management and stabilization. 2. Medication management to reduce current symptoms to base line and improve the patient's overall level of functioning. Continue Wellbutrin SR 100 mg daily which can be titrated up to 300 mg as needed medically 3. Treat health problems as indicated. 4. Develop treatment plan to decrease risk of relapse upon discharge and to reduce the need for readmission. 5. Psycho-social education regarding relapse prevention and self care. 6. Health care follow up as needed for medical problems. 7. Restart home medications where appropriate. 8. disposition plans are in progress and patient may be discharged on Monday when he showed clinical improvement and contracts for safety. 9. Anticipating D/C on Monday.  Medical Decision Making Problem Points:  Established problem, worsening (2), New problem, with no additional work-up planned (3), Review of last therapy session (1) and Review of psycho-social stressors (1) Data Points:  Review or order clinical lab tests (1) Review or order medicine tests (1) Review of medication regiment & side effects (2) Review of new medications or change in dosage (2)  I certify that inpatient services furnished can reasonably be expected to improve the patient's condition.  Rona RavensNeil T. Broghan Pannone RPAC 5:13 PM 03/08/2014

## 2014-03-09 MED ORDER — BUPROPION HCL ER (SR) 100 MG PO TB12: 100.0000 mg | ORAL_TABLET | Freq: Every day | ORAL | Status: DC

## 2014-03-09 NOTE — Progress Notes (Signed)
  D: Pt observed sleeping in bed with eyes closed. RR even and unlabored. No distress noted  .  A: Q 15 minute checks were done for safety.  R: safety maintained on unit.  

## 2014-03-09 NOTE — [facility] Suicide Risk Assessment (Signed)
   Demographic Factors:  Adolescent or young adult and Caucasian  Total Time spent with patient: 45 minutes  Psychiatric Specialty Exam: Physical Exam  ROS  Blood pressure 124/85, pulse 67, temperature 96.7 F (35.9 C), temperature source Oral, resp. rate 16, height 5\' 9"  (1.753 m), weight 66.225 kg (146 lb).Body mass index is 21.55 kg/(m^2).  General Appearance: Well Groomed  Patent attorney::  Good  Speech:  Normal Rate  Volume:  Normal  Mood:  Euthymic and improved compared to admission  Affect:  Appropriate and reactive  Thought Process:  Goal Directed  Orientation:  Full (Time, Place, and Person)  Thought Content:  no psychotic symptoms   Suicidal Thoughts:  No  Homicidal Thoughts:  No  Memory:  Negative  Judgement:  Good  Insight:  Fair  Psychomotor Activity:  Normal  Concentration:  Good  Recall:  Good  Fund of Knowledge:Good  Language: Good  Akathisia:  No  Handed:  Right  AIMS (if indicated):     Assets:  Communication Skills Desire for Improvement Social Support Talents/Skills  Sleep:  Number of Hours: 4.75    Musculoskeletal: Strength & Muscle Tone: within normal limits Gait & Station: normal Patient leans: Right and N/A   Mental Status Per Nursing Assessment::   On Admission:  Self-harm thoughts  Current Mental Status by Physician: At this time patient not suicidal , not homicidal , not psychotic , future oriented, no psychotic symptoms  Loss Factors: recently evicted, and mother did not want to let him return home to live if  active substance abuse   Historical Factors: Impulsivity  Risk Reduction Factors:   Positive social support and has a passion for skateboarding  Continued Clinical Symptoms:  Depression:   Anhedonia Alcohol/Substance Abuse/Dependencies  Cognitive Features That Contribute To Risk:  Closed-mindedness    Suicide Risk:  Mild:  Suicidal ideation of limited frequency, intensity, duration, and specificity.  There are no  identifiable plans, no associated intent, mild dysphoria and related symptoms, good self-control (both objective and subjective assessment), few other risk factors, and identifiable protective factors, including available and accessible social support.  Discharge Diagnoses:  AXIS I: Major Depression, single episode and Substance Abuse  AXIS II: Deferred  AXIS III: History reviewed. No pertinent past medical history.  AXIS IV: economic problems, housing problems, other psychosocial or environmental problems, problems related to social environment and problems with primary support group AXIS V: 65 currently    Plan Of Care/Follow-up recommendations:  Activity:  as tolerated  Diet:  regular Tests:  NA Other:  Will follow up as outpatient for ongoing medication management Glendell Docker). We have also discussed the importance of cannabis abstinence/ cessation and I have encouraged 12 step prograM ( NA) participation.  Patient states he is motivated in not using any drugs ( cannabis is substance of choice), which was a condition his mother stipulated for him to be able to return home) . He plans to stay away from people, places and situations he associates with drug use.   Is patient on multiple antipsychotic therapies at discharge:  No   Has Patient had three or more failed trials of antipsychotic monotherapy by history:  No  Recommended Plan for Multiple Antipsychotic Therapies: NA    Fernando Cobos 03/09/2014, 2:23 PM

## 2014-03-09 NOTE — Progress Notes (Signed)
D) Pt being discharged to home. Mood and affect are appropriate. Pt denies SI and HI, delusions and hallucinations. Pt rates his depression and hopelessness both at a 0. A) All belongings returned to Pt along with all his discharge plans and medications explained to Pt. Pt given support, reassurance and praise, along with encouragement. R) Pt denies SI and HI

## 2014-03-09 NOTE — Progress Notes (Signed)
[facility] Adult Case Management Discharge Plan :  Will you be returning to the same living situation after discharge: Yes,  Patient is returning to his mother's home. At discharge, do you have transportation home?:Yes,  Patient to arrange transportation home. Do you have the ability to pay for your medications:Yes,  Patient is able to obtain medications.  Release of information consent forms completed and in the chart;  Patient's signature needed at discharge.  Patient to Follow up at: Follow-up Information   Follow up with Glendell Docker On 03/16/2014. (Monday, March 16, 2014 at 11 A<)    Contact information:   [address] [place], [place]   [address]  [phone]      Follow up with Dr. Lolly Mustache - [facility] On 04/09/2014. (Thursday, April 09, 2013 at [facility].  Please arrive at 8:30 and bring completed registration form)    Contact information:   [address] [place], [place]   [address]  [phone]      Patient denies SI/HI:   Patient no longer endorsing SI/HI or other thoughts of self harm.   Safety Planning and Suicide Prevention discussed:  .Reviewed with all patients during discharge planning group   Druscilla Petsch Hairston Heaven Meeker 03/09/2014, 1:04 PM

## 2014-03-09 NOTE — [facility] Group Notes (Signed)
[facility] LCSW Aftercare Discharge Planning Group Note   03/09/2014 9:25 AM    Participation Quality:  Appropraite  Mood/Affect:  Appropriate  Depression Rating:  1  Anxiety Rating:  1  Thoughts of Suicide:  No  Will you contract for safety?   NA  Current AVH:  No  Plan for Discharge/Comments:  Patient attended discharge planning group and actively participated in group.  He will follow up with [facility] and Glendell Docker.  CSW provided all participants with daily workbook.   Transportation Means: Patient has transportation.   Supports:  Patient has a support system.   Shanicka Oldenkamp, Joesph July

## 2014-03-13 NOTE — Progress Notes (Signed)
Patient Discharge Instructions:  After Visit Summary (AVS):   Faxed to:  03/13/14 Discharge Summary Note:   Faxed to:  03/13/14 Psychiatric Admission Assessment Note:   Faxed to:  03/13/14 Suicide Risk Assessment - Discharge Assessment:   Faxed to:  03/13/14 Faxed/Sent to the Next Level Care provider:  03/13/14 Next Level Care Provider Has Access to the EMR, 03/13/14 Faxed to Glendell DockerMatthew McMillan @ [phone] [facility] via [facility]/Epic access.  Jerelene ReddenSheena E [place], 03/13/2014, 11:25 AM
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
## 2014-03-26 NOTE — Discharge Summary (Signed)
Patient seen, Suicide Assessment Completed.  Disposition Plan Reviewed  

## 2014-04-09 ENCOUNTER — Ambulatory Visit (HOSPITAL_COMMUNITY): Payer: Self-pay | Admitting: Psychiatry

## 2014-12-23 ENCOUNTER — Ambulatory Visit: Payer: Self-pay | Admitting: Pediatrics

## 2015-08-07 ENCOUNTER — Emergency Department
Admission: EM | Admit: 2015-08-07 | Discharge: 2015-08-07 | Disposition: A | Discharge status: Home / Self Care | Payer: 59 | Attending: Emergency Medicine | Admitting: Emergency Medicine

## 2015-08-07 ENCOUNTER — Encounter: Payer: Self-pay | Admitting: Emergency Medicine

## 2015-08-07 DIAGNOSIS — K529 Noninfective gastroenteritis and colitis, unspecified: Secondary | ICD-10-CM | POA: Insufficient documentation

## 2015-08-07 DIAGNOSIS — Z720 Tobacco use: Secondary | ICD-10-CM | POA: Insufficient documentation

## 2015-08-07 DIAGNOSIS — Z79899 Other long term (current) drug therapy: Secondary | ICD-10-CM | POA: Diagnosis not present

## 2015-08-07 DIAGNOSIS — R112 Nausea with vomiting, unspecified: Secondary | ICD-10-CM | POA: Diagnosis present

## 2015-08-07 MED ORDER — ONDANSETRON HCL 4 MG/2ML IJ SOLN
4.0000 mg | Freq: Once | INTRAMUSCULAR | Status: AC
  Administered 2015-08-07: 4 mg via INTRAVENOUS
  Filled 2015-08-07: qty 2

## 2015-08-07 MED ORDER — SODIUM CHLORIDE 0.9 % IV BOLUS (SEPSIS)
1000.0000 mL | Freq: Once | INTRAVENOUS | Status: AC
Start: 2015-08-07 — End: 2015-08-07
  Administered 2015-08-07: 1000 mL via INTRAVENOUS

## 2015-08-07 MED ORDER — ONDANSETRON HCL 4 MG PO TABS: 4.0000 mg | ORAL_TABLET | Freq: Three times a day (TID) | ORAL | Status: AC | PRN

## 2015-08-07 NOTE — ED Notes (Signed)
Dehydration, vomiting x1 day

## 2015-08-07 NOTE — ED Notes (Signed)
Pt to ed with c/o vomiting and diarrhea that started about 6 am today.  Pt denies abd pain.  Pt states vomited x 2 since 6 am.

## 2015-08-07 NOTE — ED Provider Notes (Signed)
Time Seen: Approximately. ----------------------------------------- 9:37 AM on 08/07/2015 -----------------------------------------     I have reviewed the triage notes  Chief Complaint: No chief complaint on file.   History of Present Illness: Gary Washington is a 19 y.o. male who presents with acute onset this morning of nausea, vomiting, loose stool. Patient describes no melena or hematochezia, no hematemesis or biliary emesis. The patient states he still feels nauseated and after vomiting a couple times had some dry heaves. He describes some mild diffuse abdominal cramping. Patient states the abdominal cramping started after the nausea and the diarrhea. He describes it as loose watery stool without any melena or hematochezia. He has not been on any recent antibiotics, denies travel,. He states he last ate at [employer] last night at 1 AM. He is not aware of anyone else having any issues from the restaurant. He denies any fever at home.   History reviewed. No pertinent past medical history.  Patient Active Problem List   Diagnosis Date Noted  . Suicidal ideation 03/04/2014    History reviewed. No pertinent past surgical history.  History reviewed. No pertinent past surgical history.  Current Outpatient Rx  Name  Route  Sig  Dispense  Refill  . buPROPion (WELLBUTRIN SR) 100 MG 12 hr tablet   Oral   Take 1 tablet (100 mg total) by mouth daily.   30 tablet   0     Allergies:  Review of patient's allergies indicates no known allergies.  Family History: History reviewed. No pertinent family history.  Social History: Social History  Substance Use Topics  . Smoking status: Current Every Day Smoker  . Smokeless tobacco: None  . Alcohol Use: Yes     Review of Systems:   10 point review of systems was performed and was otherwise negative:  Constitutional: No fever Eyes: No visual disturbances ENT: No sore throat, ear pain Cardiac: No chest pain Respiratory: No  shortness of breath, wheezing, or stridor Abdomen: Diffuse abdominal pain, no active vomiting Endocrine: No weight loss, No night sweats Extremities: No peripheral edema, cyanosis Skin: No rashes, easy bruising Neurologic: No focal weakness, trouble with speech or swollowing Urologic: No dysuria, Hematuria, or urinary frequency   Physical Exam:  ED Triage Vitals  Enc Vitals Group     BP 08/07/15 0917 133/66 mmHg     Pulse Rate 08/07/15 0917 61     Resp 08/07/15 0917 20     Temp 08/07/15 0917 97.6 F (36.4 C)     Temp Source 08/07/15 0917 Oral     SpO2 08/07/15 0917 100 %     Weight 08/07/15 0917 146 lb (66.225 kg)     Height 08/07/15 0917 5\' 10"  (1.778 m)     Head Cir --      Peak Flow --      Pain Score 08/07/15 0918 2     Pain Loc --      Pain Edu? --      Excl. in GC? --     General: Awake , Alert , and Oriented times 3; GCS 15 Head: Normal cephalic , atraumatic Eyes: Pupils equal , round, reactive to light Nose/Throat: No nasal drainage, patent upper airway without erythema or exudate. Moist mucous membranes  Neck: Supple, Full range of motion, No anterior adenopathy or palpable thyroid masses Lungs: Clear to ascultation without wheezes , rhonchi, or rales Heart: Regular rate, regular rhythm without murmurs , gallops , or rubs Abdomen: Soft, non tender without  rebound, guarding , or rigidity; bowel sounds positive and symmetric in all 4 quadrants. No organomegaly .        Extremities: 2 plus symmetric pulses. No edema, clubbing or cyanosis Neurologic: normal ambulation, Motor symmetric without deficits, sensory intact Skin: warm, dry, no rashes     ED Course:  Repeat exam of the abdomen showed no peritoneal signs. Patient will be started on outpatient Zofran just for a couple of days and had IV Zofran here in emergency department tolerated it well. Patient was advised to advance his diet as tolerated and drink plenty of fluids. Return here especially if he has  hematemesis, bloody diarrhea, high fever, focal abdominal pain, or any other new concerns.   Assessment:  Acute viral gastroenteritis     Plan: Outpatient management Patient was advised to return immediately if condition worsens. Patient was advised to follow up with her primary care physician or other specialized physicians involved and in their current assessment.             Jennye Moccasin, MD 08/07/15 [phone]

## 2015-09-06 ENCOUNTER — Encounter (HOSPITAL_COMMUNITY): Payer: Self-pay | Admitting: Family Medicine

## 2015-09-06 ENCOUNTER — Emergency Department (HOSPITAL_COMMUNITY)
Admission: EM | Admit: 2015-09-06 | Discharge: 2015-09-06 | Disposition: A | Discharge status: Home / Self Care | Payer: 59 | Attending: Emergency Medicine | Admitting: Emergency Medicine

## 2015-09-06 DIAGNOSIS — F17200 Nicotine dependence, unspecified, uncomplicated: Secondary | ICD-10-CM | POA: Diagnosis not present

## 2015-09-06 DIAGNOSIS — Z79899 Other long term (current) drug therapy: Secondary | ICD-10-CM | POA: Diagnosis not present

## 2015-09-06 DIAGNOSIS — F1210 Cannabis abuse, uncomplicated: Secondary | ICD-10-CM | POA: Diagnosis not present

## 2015-09-06 DIAGNOSIS — F419 Anxiety disorder, unspecified: Secondary | ICD-10-CM | POA: Diagnosis present

## 2015-09-06 LAB — CBC WITH DIFFERENTIAL/PLATELET
BASOS ABS: 0 10*3/uL (ref 0.0–0.1)
Basophils Relative: 1 %
EOS PCT: 2 %
Eosinophils Absolute: 0.1 10*3/uL (ref 0.0–0.7)
HCT: 44.9 % (ref 39.0–52.0)
Hemoglobin: 14.9 g/dL (ref 13.0–17.0)
LYMPHS ABS: 2.3 10*3/uL (ref 0.7–4.0)
Lymphocytes Relative: 33 %
MCH: 29.4 pg (ref 26.0–34.0)
MCHC: 33.2 g/dL (ref 30.0–36.0)
MCV: 88.7 fL (ref 78.0–100.0)
MONO ABS: 0.4 10*3/uL (ref 0.1–1.0)
Monocytes Relative: 6 %
Neutro Abs: 4 10*3/uL (ref 1.7–7.7)
Neutrophils Relative %: 58 %
PLATELETS: 209 10*3/uL (ref 150–400)
RBC: 5.06 MIL/uL (ref 4.22–5.81)
RDW: 12.9 % (ref 11.5–15.5)
WBC: 6.9 10*3/uL (ref 4.0–10.5)

## 2015-09-06 LAB — COMPREHENSIVE METABOLIC PANEL
ALT: 14 U/L — AB (ref 17–63)
AST: 23 U/L (ref 15–41)
Albumin: 4.7 g/dL (ref 3.5–5.0)
Alkaline Phosphatase: 79 U/L (ref 38–126)
Anion gap: 5 (ref 5–15)
BILIRUBIN TOTAL: 0.8 mg/dL (ref 0.3–1.2)
CO2: 28 mmol/L (ref 22–32)
Calcium: 9.6 mg/dL (ref 8.9–10.3)
Chloride: 106 mmol/L (ref 101–111)
Creatinine, Ser: 0.89 mg/dL (ref 0.61–1.24)
GFR calc Af Amer: >60 mL/min (ref >60)
Glucose, Bld: 107 mg/dL — ABNORMAL HIGH (ref 65–99)
Potassium: 4.2 mmol/L (ref 3.5–5.1)
Sodium: 139 mmol/L (ref 135–145)
TOTAL PROTEIN: 7.2 g/dL (ref 6.5–8.1)

## 2015-09-06 LAB — RAPID URINE DRUG SCREEN, HOSP PERFORMED
AMPHETAMINES: NOT DETECTED
Barbiturates: NOT DETECTED
Benzodiazepines: NOT DETECTED
Cocaine: NOT DETECTED
OPIATES: NOT DETECTED
Tetrahydrocannabinol: POSITIVE — AB

## 2015-09-06 LAB — TSH: TSH: 4.251 u[IU]/mL (ref 0.350–4.500)

## 2015-09-06 NOTE — Discharge Instructions (Signed)
Panic Attacks °Panic attacks are sudden, short-lived surges of severe anxiety, fear, or discomfort. They may occur for no reason when you are relaxed, when you are anxious, or when you are sleeping. Panic attacks may occur for a number of reasons:  °· Healthy people occasionally have panic attacks in extreme, life-threatening situations, such as war or natural disasters. Normal anxiety is a protective mechanism of the body that helps us react to danger (fight or flight response). °· Panic attacks are often seen with anxiety disorders, such as panic disorder, social anxiety disorder, generalized anxiety disorder, and phobias. Anxiety disorders cause excessive or uncontrollable anxiety. They may interfere with your relationships or other life activities. °· Panic attacks are sometimes seen with other mental illnesses, such as depression and posttraumatic stress disorder. °· Certain medical conditions, prescription medicines, and drugs of abuse can cause panic attacks. °SYMPTOMS  °Panic attacks start suddenly, peak within 20 minutes, and are accompanied by four or more of the following symptoms: °· Pounding heart or fast heart rate (palpitations). °· Sweating. °· Trembling or shaking. °· Shortness of breath or feeling smothered. °· Feeling choked. °· Chest pain or discomfort. °· Nausea or strange feeling in your stomach. °· Dizziness, light-headedness, or feeling like you will faint. °· Chills or hot flushes. °· Numbness or tingling in your lips or hands and feet. °· Feeling that things are not real or feeling that you are not yourself. °· Fear of losing control or going crazy. °· Fear of dying. °Some of these symptoms can mimic serious medical conditions. For example, you may think you are having a heart attack. Although panic attacks can be very scary, they are not life threatening. °DIAGNOSIS  °Panic attacks are diagnosed through an assessment by your health care provider. Your health care provider will ask  questions about your symptoms, such as where and when they occurred. Your health care provider will also ask about your medical history and use of alcohol and drugs, including prescription medicines. Your health care provider may order blood tests or other studies to rule out a serious medical condition. Your health care provider may refer you to a mental health professional for further evaluation. °TREATMENT  °· Most healthy people who have one or two panic attacks in an extreme, life-threatening situation will not require treatment. °· The treatment for panic attacks associated with anxiety disorders or other mental illness typically involves counseling with a mental health professional, medicine, or a combination of both. Your health care provider will help determine what treatment is best for you. °· Panic attacks due to physical illness usually go away with treatment of the illness. If prescription medicine is causing panic attacks, talk with your health care provider about stopping the medicine, decreasing the dose, or substituting another medicine. °· Panic attacks due to alcohol or drug abuse go away with abstinence. Some adults need professional help in order to stop drinking or using drugs. °HOME CARE INSTRUCTIONS  °· Take all medicines as directed by your health care provider.   °· Schedule and attend follow-up visits as directed by your health care provider. It is important to keep all your appointments. °SEEK MEDICAL CARE IF: °· You are not able to take your medicines as prescribed. °· Your symptoms do not improve or get worse. °SEEK IMMEDIATE MEDICAL CARE IF:  °· You experience panic attack symptoms that are different than your usual symptoms. °· You have serious thoughts about hurting yourself or others. °· You are taking medicine for panic attacks and   have a serious side effect. °MAKE SURE YOU: °· Understand these instructions. °· Will watch your condition. °· Will get help right away if you are not  doing well or get worse. °  °This information is not intended to replace advice given to you by your health care provider. Make sure you discuss any questions you have with your health care provider. °  °Document Released: 09/18/2005 Document Revised: 09/23/2013 Document Reviewed: 05/02/2013 °Elsevier Interactive Patient Education ©2016 Elsevier Inc. ° ° ° ° °Emergency Department Resource Guide °1) Find a Doctor and Pay Out of Pocket °Although you won't have to find out who is covered by your insurance plan, it is a good idea to ask around and get recommendations. You will then need to call the office and see if the doctor you have chosen will accept you as a new patient and what types of options they offer for patients who are self-pay. Some doctors offer discounts or will set up payment plans for their patients who do not have insurance, but you will need to ask so you aren't surprised when you get to your appointment. ° °2) Contact Your Local Health Department °Not all health departments have doctors that can see patients for sick visits, but many do, so it is worth a call to see if yours does. If you don't know where your local health department is, you can check in your phone book. The CDC also has a tool to help you locate your state's health department, and many state websites also have listings of all of their local health departments. ° °3) Find a Walk-in Clinic °If your illness is not likely to be very severe or complicated, you may want to try a walk in clinic. These are popping up all over the country in pharmacies, drugstores, and shopping centers. They're usually staffed by nurse practitioners or physician assistants that have been trained to treat common illnesses and complaints. They're usually fairly quick and inexpensive. However, if you have serious medical issues or chronic medical problems, these are probably not your best option. ° °No Primary Care Doctor: °- Call [facility] at  832-8000 -  they can help you locate a primary care doctor that  accepts your insurance, provides certain services, etc. °- Physician Referral Service- 1-800-533-3463 ° °Chronic Pain Problems: °Organization         Address  Phone   Notes  °[facility]  (336) 297-2271 Patients need to be referred by their primary care doctor.  ° °Medication Assistance: °Organization         Address  Phone   Notes  °[place] [facility] [address] °[place], [place] [address] (336) 641-8030 --Must be a resident of [place] °-- Must have NO insurance coverage whatsoever (no Medicaid/ Medicare, etc.) °-- The pt. MUST have a primary care doctor that directs their care regularly and follows them in the community °  °[facility]  (866) 331-1348   °[employer]  (888) 892-1162   ° °Agencies that provide inexpensive medical care: °Organization         Address  Phone   Notes  °[facility]  (336) 832-8035   °[facility]    (336) 832-7272   °[facility] [address] °[place], [place] [address] (336) 832-4777   °[facility] [address], °[place] (336) 271-4999   °[facility]    (336) 373-0678   °  [facility]    (336) 272-1050   °[facility] ° [address], [place] Phone:  (336) 832-4444, Fax:  (336) 832-4440 Hours of Operation:  9 am - 6 pm, M-F.  Also accepts Medicaid/Medicare and self-pay.  °[facility] ° [address], [place] Phone: (336) 832-3150, Fax: (336) [phone]. Hours of Operation:  8:30 am - 5:30 pm, M-F.  Also accepts Medicaid and self-pay.  °[facility] [place] [address], [place] Phone: (336) 878-6027   °[facility] [address], [place], [place] (336)723-1848, Ext. 123 Mondays & Thursdays: 7-9 AM.  First 15 patients are seen on a first come, first serve basis. °  ° °Medicaid-accepting  [place] Providers: ° °Organization         Address  Phone   Notes  °[facility] [address], [place] (336) 641-2100 Also accepts self-pay patients.  °[facility] [address], [place] ° (336) 856-9996   °[facility] [address], [place] (336) 288-8857   °[facility] [address] [place] [address], [place] (336) 299-7000   °Veita Bland [address], [place]  ° (336) 373-1557 Only accepts [place] Access Medicaid patients after they have their name applied to their card.  ° °Self-Pay (no insurance) in [place]: ° °Organization         Address  Phone   Notes  °Sickle Cell Patients, [facility] [address], [place] (336) 832-1970   °[facility] [address], [place] (336) 832-4400   °[facility] ° [address],  (336) 992-4800   °[facility]/Dr. Osei-Bonsu ° [address] [place] [address], [place] or [address], [place] (336) [phone] Phone number for both [place] and [place] locations is the same.  °[facility] [address], [place] (336) 299-0000   °[facility] [address] [place] [address], [place] or [address] (336) 852-7530 °(336) 878-2260   °[facility] [address],  (336) 350-1642, phone; (336) 294-5005, fax Sees patients 1st and 3rd Saturday of every month.  Must not qualify for public or private insurance (i.e. Medicaid, Medicare, [place] Health Choice, Veterans' Benefits) • Household income should be no more than 200% of the poverty level •The clinic cannot treat you if you are pregnant or think you are pregnant • Sexually transmitted diseases are not treated at the clinic.  ° ° °Dental Care: °Organization         Address  Phone  Notes  °[place] [facility] [address],  (336) 641-6152 Accepts children up to age 21 who are enrolled in Medicaid or [place] Health Choice; pregnant women with a Medicaid card; and children who have applied for Medicaid or [place] Health Choice, but were declined, whose parents can pay a reduced fee at time of service.  °[facility] [place]  [address], [place] (336) 641-7733 Accepts children up to age 21 who are enrolled in Medicaid or [place] Health Choice; pregnant women with a Medicaid card; and children who have applied for Medicaid or [place] Health Choice, but were declined, whose parents can pay a reduced fee at time   of service.  °[facility] ° [address], [place] (336) 641-4533 Patients are seen by appointment only. Walk-ins are not accepted. [facility] will see patients 18 years of age and older. °Monday - Tuesday (8am-5pm) °Most Wednesdays (8:30-5pm) °$30 per visit, cash only  °[facility] ° [address], [place] (336) 641-4533 Patients are seen by appointment only. Walk-ins are not accepted. [facility] will see patients 18 years of age and older. °One Wednesday Evening (Monthly: Volunteer Based).  $30 per visit, cash only  °[facility]  (919) 537-3737 for adults; Children under age 4, call Graduate Pediatric Dentistry at ([phone]) [phone]. Children aged 4-14, please call (919) [phone] to request a pediatric application. ° Dental services are provided in all areas of dental care including fillings, crowns and bridges, complete and partial dentures, implants, gum treatment, root canals, and extractions. Preventive care is also provided. Treatment is provided to both adults and children. °Patients are selected via a lottery and there is often a waiting list. °  °[facility] [address], °[place] ° (336) 763-8833 [email or web address] °  °[facility] [address], [place], [place] (336)723-1848, Ext. 123 Second and Fourth Thursday of each month, opens at 6:30 AM; Clinic ends at 9 AM.  Patients are seen on a first-come first-served basis, and a limited number are seen during each clinic.  ° °[facility] ° [address], [place], [place] (336) 723-7904   Eligibility Requirements °You must have lived in [place], [place], or [place] counties for at least the last three months. °  You cannot be eligible for state or federal sponsored healthcare insurance, including Veterans Administration, Medicaid, or Medicare. °  You generally cannot be eligible for healthcare insurance through your employer.  °  How to apply: °Eligibility screenings are held every Tuesday and Wednesday afternoon from 1:00 pm until 4:00 pm. You do not need an appointment for the interview!  °[facility] [address], [place], [place] 336-631-2330   °[facility]  336-342-8273   °[facility]  336-703-3100   °[facility]  336-570-6415   ° °Behavioral Health Resources in the Community: °Intensive Outpatient Programs °Organization         Address  Phone  Notes  °[facility] [address], [place], [place] 336-878-6098   °[facility] [address], [place], [place] 336-832-9800   °[facility] [address], [place], [place] ° 336-882-2125   °[facility] [address],  °[place],  1-800-853-5163 or 336-641-4981   °Substance Abuse Resources °Organization         Address  Phone  Notes  °[facility]  336-882-2125   °[facility]  336-784-9470   °[facility]  336-285-9073   °[facility]  336-845-3988   °Residential & Outpatient Substance Abuse Program  1-800-659-3381   °Psychological Services °Organization         Address  Phone  Notes  °[facility]   336- 832-9600   °[facility]  336- 378-7881   °[facility] [address],  1-800-853-5163 or 336-641-4981   ° °Mobile Crisis Teams °Organization         Address  Phone  Notes  °[facility], Mobile Crisis Care Unit  1-877-626-1772   °Assertive °Psychotherapeutic   Services ° [address] [place], [place] 336-834-9664   °Sharon DeEsch [address] °[place] 336-554-5454   ° °Self-Help/Support Groups °Organization         Address  Phone             Notes  °[facility] - variety of support groups  336- 373-1402 Call for more information  °Narcotics Anonymous (NA), [facility] [address], °[place]  2 meetings at this location  ° °Residential Treatment Programs °Organization         Address  Phone  Notes  °[facility] [address],    °[place]  1-866-801-8205   °[facility] ° [address], [place], [place] 704-293-8524   °[facility] [address], [place] 336-845-3988 Admissions: 8am-3pm M-F  °[facility] [address]    °[place], [place] 336-841-1104   °[facility] [address], [place], [place] 336-379-7146   °[facility] [address]  °[place], [place] 336-285-9073   °[facility] - Intensive Outpatient [address], [place], [place] 336-852-3033   °[facility] [address]  °[place], [place] 1-877-615-2722 or 336-784-9470   °[facility] [address] [place], [place] 336-227-7417 Accepts Medicaid  °[facility] [address]  °[place] 1-800-659-3381 Substance Abuse/Addiction Treatment  ° °[place] Behavioral Health Resources °Organization         Address  Phone  Notes  °[facility]  (888) 581-9988   °Julie Brannon, PhD [address] [place], [place]   (336) 349-5553 or  (336) 951-0000   °[facility]   [address] °[place], [place] (336) 349-4454   °[facility] [address], [place], [place] (336) 342-8316 Insurance/Medicaid/sponsorship through [facility]  °[facility] [address]                                    [place], [place] (336) 342-8316 Therapy/tele-psych/case  °[facility] [address]  ° [place], [place] (336) 349-2233    °Dr. Arfeen  (336) 349-4544   °[facility] [place]  [employer] [place] [facility] 1) [address],  °2) [address], [place] °3)  [address], [place] (336) 349-3220 °(336) 342-7768 ° °(336) 342-8140   °[place] Child Abuse [facility] (336) 342-1394 or (336) 342-3537 (After Hours)    ° ° ° °

## 2015-09-06 NOTE — ED Provider Notes (Signed)
CSN: [id number]     Arrival date & time 09/06/15  1133 History  By signing my name below, I, Jarvis Morganaylor Ferguson, attest that this documentation has been prepared under the direction and in the presence of NP. Electronically Signed: Jarvis Morganaylor Ferguson, ED Scribe. 09/06/2015. 3:51 PM.    Chief Complaint  Patient presents with  . Anxiety   The history is provided by the patient and a parent. No language interpreter was used.    HPI Comments: [name] is a 19 y.o. male with a h/o ADD and major depressive disorder who presents to the Emergency Department complaining of intermittent, moderate, episodic, anxiety for 1 month. He states that about 4 week ago he had an a pre syncopal episode and he has been experiencing anxiety ever since. He reports associated trouble sleeping, decreased appetite, SOB, abdominal pain, and tremors. Pt notes at times he wakes up feeling very SOB and like "an elephant is sitting on his chest". He reports that is currently trying to quit smoking marijuana and this is exacerbating his anxiety. Mother also wants him to have blood work done. He is not currently taking medication for his ADD and has not taken Adderall or similar medication for a significant amount of time. Pt reports he has followed up about his anxiety issues previously but never received treatment for it. Mother notes he was at [facility] ago and per notes was diagnosed with major depressive disorder. He states he was not put on medication and did not follow up with a counselor at the time. He denies any SI, HI, or other associated symptoms at this time.   History reviewed. No pertinent past medical history. History reviewed. No pertinent past surgical history. History reviewed. No pertinent family history. Social History  Substance Use Topics  . Smoking status: Current Every Day Smoker  . Smokeless tobacco: None  . Alcohol Use: Yes    Review of Systems  All other systems reviewed and are  negative.     Allergies  Review of patient's allergies indicates no known allergies.  Home Medications   Prior to Admission medications   Medication Sig Start Date End Date Taking? Authorizing Provider  buPROPion (WELLBUTRIN SR) 100 MG 12 hr tablet Take 1 tablet (100 mg total) by mouth daily. 03/09/14   Beau FannyJohn C Withrow, FNP   Triage Vitals: BP 129/86 mmHg  Pulse 64  Temp(Src) 98 F (36.7 C) (Oral)  Resp 18  SpO2 99%  Physical Exam  Constitutional: He is oriented to person, place, and time. He appears well-developed and well-nourished. No distress.  HENT:  Head: Normocephalic and atraumatic.  Eyes: Conjunctivae and EOM are normal.  Neck: Neck supple. No tracheal deviation present.  Cardiovascular: Normal rate.   Pulmonary/Chest: Effort normal. No respiratory distress.  Musculoskeletal: Normal range of motion.  Neurological: He is alert and oriented to person, place, and time.  Skin: Skin is warm and dry.  Psychiatric: He has a normal mood and affect. His behavior is normal.  Nursing note and vitals reviewed.   ED Course  Procedures (including critical care time)  DIAGNOSTIC STUDIES: Oxygen Saturation is 99% on RA, normal by my interpretation.    COORDINATION OF CARE:    Labs Review Labs Reviewed - No data to display  Imaging Review No results found. I have personally reviewed and evaluated these images and lab results as part of my medical decision-making.   EKG Interpretation None      MDM   Final diagnoses:  Anxiety  Marijuana abuse    Pt not si/hi. Will given resources. Labs normal. tsh pending  I personally performed the services described in this documentation, which was scribed in my presence. The recorded information has been reviewed and is accurate.     Teressa Lower, NP 09/06/15 1721  Alvira Monday, MD 09/06/15 2223

## 2015-09-06 NOTE — ED Notes (Signed)
Pt from home for eval of increased anxiety, states woke up feeling like an elephant was on his chest and had some shaking. Pt states has been trying to quit marijuana and states anxiety is worse. Per mother, pt states want pt to be able to eat, sleep and interested in checking blood work. nad noted.

## 2015-09-06 NOTE — ED Notes (Signed)
Pt here for anxiety. sts that about 4 weeks ago he had an episode of pre syncope and since has been anxious and not able to sleep. sts that he  Also is trying to get off of marijuana and making him anxious. Mom wants him to be checked for anemia and thyroid issues.

## 2015-11-26 ENCOUNTER — Other Ambulatory Visit: Payer: Self-pay | Admitting: Orthopedic Surgery

## 2015-11-26 DIAGNOSIS — S5011XA Contusion of right forearm, initial encounter: Secondary | ICD-10-CM

## 2015-11-26 DIAGNOSIS — R2231 Localized swelling, mass and lump, right upper limb: Secondary | ICD-10-CM

## 2015-12-14 ENCOUNTER — Ambulatory Visit
Admission: RE | Admit: 2015-12-14 | Discharge: 2015-12-14 | Disposition: A | Discharge status: Home / Self Care | Payer: 59 | Source: Ambulatory Visit | Attending: Orthopedic Surgery | Admitting: Orthopedic Surgery

## 2015-12-14 DIAGNOSIS — S5011XA Contusion of right forearm, initial encounter: Secondary | ICD-10-CM | POA: Insufficient documentation

## 2015-12-14 DIAGNOSIS — X58XXXA Exposure to other specified factors, initial encounter: Secondary | ICD-10-CM | POA: Diagnosis not present

## 2015-12-14 DIAGNOSIS — R2231 Localized swelling, mass and lump, right upper limb: Secondary | ICD-10-CM | POA: Insufficient documentation

## 2015-12-29 ENCOUNTER — Encounter: Payer: Self-pay | Admitting: *Deleted

## 2015-12-29 ENCOUNTER — Other Ambulatory Visit: Payer: Self-pay

## 2015-12-29 NOTE — Patient Instructions (Signed)
  Your procedure is scheduled on: 01-04-16 Report to [facility] 2ND FLOOR To find out your arrival time please call [phone] between 1PM - 3PM on 01-03-16  Remember: Instructions that are not followed completely may result in serious medical risk, up to and including death, or upon the discretion of your surgeon and anesthesiologist your surgery may need to be rescheduled.    _X___ 1. Do not eat food or drink liquids after midnight. No gum chewing or hard candies.     _X___ 2. No Alcohol for 24 hours before or after surgery.   ____ 3. Bring all medications with you on the day of surgery if instructed.    _X___ 4. Notify your doctor if there is any change in your medical condition     (cold, fever, infections).     Do not wear jewelry, make-up, hairpins, clips or nail polish.  Do not wear lotions, powders, or perfumes. You may wear deodorant.  Do not shave 48 hours prior to surgery. Men may shave face and neck.  Do not bring valuables to the hospital.    [facility] is not responsible for any belongings or valuables.               Contacts, dentures or bridgework may not be worn into surgery.  Leave your suitcase in the car. After surgery it may be brought to your room.  For patients admitted to the hospital, discharge time is determined by your treatment team.   Patients discharged the day of surgery will not be allowed to drive home.   Please read over the following fact sheets that you were given:    ____ Take these medicines the morning of surgery with A SIP OF WATER:    1. NONE              2.   3.   4.  5.  6.  ____ Fleet Enema (as directed)   ____ Use CHG Soap as directed  ____ Use inhalers on the day of surgery  ____ Stop metformin 2 days prior to surgery    ____ Take 1/2 of usual insulin dose the night before surgery and none on the morning of surgery.   ____ Stop Coumadin/Plavix/aspirin-N/A  ____ Stop Anti-inflammatories-NO NSAIDS OR  ASA PRODUCTS-TYLENOL OK   ____ Stop supplements until after surgery.    ____ Bring C-Pap to the hospital.

## 2016-01-13 ENCOUNTER — Ambulatory Visit: Payer: 59 | Admitting: Anesthesiology

## 2016-01-13 ENCOUNTER — Ambulatory Visit
Admission: RE | Admit: 2016-01-13 | Discharge: 2016-01-13 | Disposition: A | Discharge status: Home / Self Care | Payer: 59 | Source: Ambulatory Visit | Attending: Orthopedic Surgery | Admitting: Orthopedic Surgery

## 2016-01-13 ENCOUNTER — Encounter: Payer: Self-pay | Admitting: *Deleted

## 2016-01-13 ENCOUNTER — Encounter
Admission: RE | Disposition: A | Discharge status: Home / Self Care | Payer: Self-pay | Source: Ambulatory Visit | Attending: Orthopedic Surgery

## 2016-01-13 DIAGNOSIS — Y939 Activity, unspecified: Secondary | ICD-10-CM | POA: Diagnosis not present

## 2016-01-13 DIAGNOSIS — S5011XA Contusion of right forearm, initial encounter: Secondary | ICD-10-CM | POA: Diagnosis not present

## 2016-01-13 DIAGNOSIS — Z79899 Other long term (current) drug therapy: Secondary | ICD-10-CM | POA: Insufficient documentation

## 2016-01-13 DIAGNOSIS — T148 Other injury of unspecified body region: Secondary | ICD-10-CM | POA: Diagnosis present

## 2016-01-13 HISTORY — PX: EXCISION MASS UPPER EXTREMETIES: SHX6704

## 2016-01-13 HISTORY — DX: Headache, unspecified: R51.9

## 2016-01-13 HISTORY — DX: Gastro-esophageal reflux disease without esophagitis: K21.9

## 2016-01-13 HISTORY — DX: Headache: R51

## 2016-01-13 LAB — URINE DRUG SCREEN, QUALITATIVE (ARMC ONLY)
AMPHETAMINES, UR SCREEN: NOT DETECTED
Barbiturates, Ur Screen: NOT DETECTED
Benzodiazepine, Ur Scrn: NOT DETECTED
COCAINE METABOLITE, UR ~~LOC~~: NOT DETECTED
Cannabinoid 50 Ng, Ur ~~LOC~~: POSITIVE — AB
MDMA (ECSTASY) UR SCREEN: NOT DETECTED
METHADONE SCREEN, URINE: NOT DETECTED
OPIATE, UR SCREEN: NOT DETECTED
Phencyclidine (PCP) Ur S: NOT DETECTED
TRICYCLIC, UR SCREEN: NOT DETECTED

## 2016-01-13 SURGERY — EXCISION MASS UPPER EXTREMITIES
Anesthesia: General | Laterality: Right | Wound class: Clean Contaminated

## 2016-01-13 MED ORDER — FAMOTIDINE 20 MG PO TABS
ORAL_TABLET | ORAL | Status: AC
  Administered 2016-01-13: 20 mg via ORAL
  Filled 2016-01-13: qty 1

## 2016-01-13 MED ORDER — NEOMYCIN-POLYMYXIN B GU 40-200000 IR SOLN
Status: AC
  Filled 2016-01-13: qty 2

## 2016-01-13 MED ORDER — FENTANYL CITRATE (PF) 100 MCG/2ML IJ SOLN
25.0000 ug | INTRAMUSCULAR | Status: DC | PRN
  Administered 2016-01-13 (×2): 50 ug via INTRAVENOUS

## 2016-01-13 MED ORDER — PROPOFOL 10 MG/ML IV BOLUS
INTRAVENOUS | Status: DC | PRN
  Administered 2016-01-13: 350 mg via INTRAVENOUS

## 2016-01-13 MED ORDER — DEXAMETHASONE SODIUM PHOSPHATE 10 MG/ML IJ SOLN
INTRAMUSCULAR | Status: DC | PRN
  Administered 2016-01-13: 10 mg via INTRAVENOUS

## 2016-01-13 MED ORDER — ONDANSETRON HCL 4 MG/2ML IJ SOLN: 4.0000 mg | Freq: Four times a day (QID) | INTRAMUSCULAR | Status: DC | PRN

## 2016-01-13 MED ORDER — HYDROCODONE-ACETAMINOPHEN 5-325 MG PO TABS
1.0000 | ORAL_TABLET | ORAL | Status: DC | PRN
  Administered 2016-01-13: 1 via ORAL

## 2016-01-13 MED ORDER — FAMOTIDINE 20 MG PO TABS
20.0000 mg | ORAL_TABLET | Freq: Once | ORAL | Status: AC
  Administered 2016-01-13: 20 mg via ORAL

## 2016-01-13 MED ORDER — FENTANYL CITRATE (PF) 100 MCG/2ML IJ SOLN
INTRAMUSCULAR | Status: DC | PRN
  Administered 2016-01-13 (×2): 50 ug via INTRAVENOUS

## 2016-01-13 MED ORDER — MIDAZOLAM HCL 2 MG/2ML IJ SOLN
INTRAMUSCULAR | Status: DC | PRN
  Administered 2016-01-13: 2 mg via INTRAVENOUS

## 2016-01-13 MED ORDER — BUPIVACAINE HCL (PF) 0.5 % IJ SOLN
INTRAMUSCULAR | Status: AC
  Filled 2016-01-13: qty 30

## 2016-01-13 MED ORDER — ONDANSETRON HCL 4 MG/2ML IJ SOLN: 4.0000 mg | Freq: Once | INTRAMUSCULAR | Status: DC | PRN

## 2016-01-13 MED ORDER — METOCLOPRAMIDE HCL 5 MG/ML IJ SOLN: 5.0000 mg | Freq: Three times a day (TID) | INTRAMUSCULAR | Status: DC | PRN

## 2016-01-13 MED ORDER — LACTATED RINGERS IV SOLN
INTRAVENOUS | Status: DC
  Administered 2016-01-13: 10:00:00 via INTRAVENOUS

## 2016-01-13 MED ORDER — SODIUM CHLORIDE 0.9 % IV SOLN: INTRAVENOUS | Status: DC

## 2016-01-13 MED ORDER — FENTANYL CITRATE (PF) 100 MCG/2ML IJ SOLN
INTRAMUSCULAR | Status: AC
  Administered 2016-01-13: 50 ug via INTRAVENOUS
  Filled 2016-01-13: qty 2

## 2016-01-13 MED ORDER — ONDANSETRON HCL 4 MG PO TABS: 4.0000 mg | ORAL_TABLET | Freq: Four times a day (QID) | ORAL | Status: DC | PRN

## 2016-01-13 MED ORDER — METOCLOPRAMIDE HCL 10 MG PO TABS: 5.0000 mg | ORAL_TABLET | Freq: Three times a day (TID) | ORAL | Status: DC | PRN

## 2016-01-13 MED ORDER — ONDANSETRON HCL 4 MG/2ML IJ SOLN
INTRAMUSCULAR | Status: DC | PRN
  Administered 2016-01-13: 4 mg via INTRAVENOUS

## 2016-01-13 MED ORDER — HYDROCODONE-ACETAMINOPHEN 5-325 MG PO TABS: 1.0000 | ORAL_TABLET | Freq: Four times a day (QID) | ORAL | Status: DC | PRN

## 2016-01-13 MED ORDER — HYDROCODONE-ACETAMINOPHEN 5-325 MG PO TABS
ORAL_TABLET | ORAL | Status: AC
  Filled 2016-01-13: qty 1

## 2016-01-13 MED ORDER — LIDOCAINE HCL (CARDIAC) 20 MG/ML IV SOLN
INTRAVENOUS | Status: DC | PRN
  Administered 2016-01-13: 100 mg via INTRAVENOUS

## 2016-01-13 SURGICAL SUPPLY — 28 items
BNDG COHESIVE 4X5 TAN STRL (GAUZE/BANDAGES/DRESSINGS) ×6 IMPLANT
BNDG ESMARK 4X12 TAN STRL LF (GAUZE/BANDAGES/DRESSINGS) ×3 IMPLANT
CANISTER SUCT 1200ML W/VALVE (MISCELLANEOUS) ×3 IMPLANT
CHLORAPREP W/TINT 26ML (MISCELLANEOUS) ×3 IMPLANT
CLOSURE WOUND 1/2 X4 (GAUZE/BANDAGES/DRESSINGS) ×1
ELECT CAUTERY BLADE 6.4 (BLADE) ×3 IMPLANT
ELECT REM PT RETURN 9FT ADLT (ELECTROSURGICAL) ×3
ELECTRODE REM PT RTRN 9FT ADLT (ELECTROSURGICAL) ×1 IMPLANT
GAUZE PETRO XEROFOAM 1X8 (MISCELLANEOUS) ×3 IMPLANT
GAUZE SPONGE 4X4 12PLY STRL (GAUZE/BANDAGES/DRESSINGS) ×3 IMPLANT
GLOVE SURG ORTHO 9.0 STRL STRW (GLOVE) ×3 IMPLANT
GOWN STRL REUS W/ TWL LRG LVL3 (GOWN DISPOSABLE) ×1 IMPLANT
GOWN STRL REUS W/TWL LRG LVL3 (GOWN DISPOSABLE) ×3
GOWN SURG XXL (GOWNS) ×3 IMPLANT
KIT RM TURNOVER STRD PROC AR (KITS) ×3 IMPLANT
NEEDLE FILTER BLUNT 18X 1/2SAF (NEEDLE) ×2
NEEDLE FILTER BLUNT 18X1 1/2 (NEEDLE) ×1 IMPLANT
NS IRRIG 500ML POUR BTL (IV SOLUTION) ×3 IMPLANT
PACK EXTREMITY ARMC (MISCELLANEOUS) ×3 IMPLANT
PAD ABD DERMACEA PRESS 5X9 (GAUZE/BANDAGES/DRESSINGS) ×3 IMPLANT
PAD CAST CTTN 4X4 STRL (SOFTGOODS) ×2 IMPLANT
PADDING CAST COTTON 4X4 STRL (SOFTGOODS) ×6
SPONGE LAP 18X18 5 PK (GAUZE/BANDAGES/DRESSINGS) ×3 IMPLANT
STOCKINETTE IMPERVIOUS 9X36 MD (GAUZE/BANDAGES/DRESSINGS) ×6 IMPLANT
STRIP CLOSURE SKIN 1/2X4 (GAUZE/BANDAGES/DRESSINGS) ×2 IMPLANT
SUT VIC AB 2-0 SH 27 (SUTURE) ×2
SUT VIC AB 2-0 SH 27XBRD (SUTURE) ×1 IMPLANT
SUT VIC AB 3-0 PS2 18 (SUTURE) ×3 IMPLANT

## 2016-01-13 NOTE — Discharge Instructions (Signed)
Keep dressing clean and dry.  Ice pack as needed.    AMBULATORY SURGERY  DISCHARGE INSTRUCTIONS   1) The drugs that you were given will stay in your system until tomorrow so for the next 24 hours you should not:  A) Drive an automobile B) Make any legal decisions C) Drink any alcoholic beverage   2) You may resume regular meals tomorrow.  Today it is better to start with liquids and gradually work up to solid foods.  You may eat anything you prefer, but it is better to start with liquids, then soup and crackers, and gradually work up to solid foods.   3) Please notify your doctor immediately if you have any unusual bleeding, trouble breathing, redness and pain at the surgery site, drainage, fever, or pain not relieved by medication.    4) Additional Instructions:        Please contact your physician with any problems or Same Day Surgery at [phone], Monday through Friday 6 am to 4 pm, or [place] at [facility] Main number at [phone].

## 2016-01-13 NOTE — Op Note (Signed)
01/13/2016  11:10 AM  PATIENT:  Brantley StageBrandon D Vanzanten  20 y.o. male  PRE-OPERATIVE DIAGNOSIS:  traumatic hematoma right forearm,mass soft tissue right upper extremity  POST-OPERATIVE DIAGNOSIS:  traumatic hematoma right forearm,mass soft tissue right upper extremity  PROCEDURE:  Procedure(s): EXCISION MASS UPPER EXTREMETIES (Right)  SURGEON: Leitha SchullerMichael J Phillip Maffei, MD  ASSISTANTS: None  ANESTHESIA:   general  EBL:  Total I/O In: 600 [I.V.:600] Out: 5 [Blood:5]  BLOOD ADMINISTERED:none  DRAINS: none   LOCAL MEDICATIONS USED:  MARCAINE     SPECIMEN:  Source of Specimen:  Subcutaneous mass right proximal forearm  DISPOSITION OF SPECIMEN:  PATHOLOGY  COUNTS:  YES  TOURNIQUET:   26 minutes at 250 mm mercury  IMPLANTS: None  DICTATION: .Dragon Dictation patient was brought to the operating room and after adequate anesthesia was obtained the arm was prepped and draped in usual sterile fashion. After patient identification and timeout procedures were completed tourniquet was raised. A incision over the proximal ulna was made with the mass being somewhat adherent to the subcutaneous skin was separated and medial and laterally the skin was elevated the mass was scar tissue and this was excised and large fragments and getting down to the muscular fascia on the dorsum of the forearm volarly it did not extend as far but when getting down to the bone there was some fluid consistent with old hematoma. The mass was excised as best possible avoiding damaging normal tissue. After the mass had been excised on palpation there is minimal scar repeat reason remaining and this was very adherent to the muscle and was left in place with approximately 90% of the mass removed. The wound was then irrigated and tourniquet let down the deep tissue was approximated using using 2-0 Vicryl to try to remove the dead space followed by 3-0 Vicryl subcutaneously and 4-0 nylon loosely to allow for drainage the wound was then  dressed with Xeroform 4 x 4's web roll ABDs and Ace wrap  PLAN OF CARE: Discharge to home after PACU  PATIENT DISPOSITION:  PACU - hemodynamically stable.

## 2016-01-13 NOTE — Anesthesia Procedure Notes (Signed)
Procedure Name: LMA Insertion Date/Time: 01/13/2016 10:15 AM Performed by: Michaele OfferSAVAGE, Karia Ehresman Pre-anesthesia Checklist: Patient identified, Emergency Drugs available, Suction available, Patient being monitored and Timeout performed Patient Re-evaluated:Patient Re-evaluated prior to inductionOxygen Delivery Method: Circle system utilized Preoxygenation: Pre-oxygenation with 100% oxygen Intubation Type: IV induction Ventilation: Mask ventilation without difficulty LMA: LMA inserted LMA Size: 3.5 Number of attempts: 1 Placement Confirmation: positive ETCO2 and breath sounds checked- equal and bilateral Tube secured with: Tape Dental Injury: Teeth and Oropharynx as per pre-operative assessment

## 2016-01-13 NOTE — Transfer of Care (Signed)
Immediate Anesthesia Transfer of Care Note  Patient: Gary Washington  Procedure(s) Performed: Procedure(s): EXCISION MASS UPPER EXTREMETIES (Right)  Patient Location: PACU  Anesthesia Type:General  Level of Consciousness: awake, alert , oriented and patient cooperative  Airway & Oxygen Therapy: Patient Spontanous Breathing and Patient connected to face mask oxygen  Post-op Assessment: Report given to RN, Post -op Vital signs reviewed and stable and Patient moving all extremities X 4  Post vital signs: Reviewed and stable  Last Vitals:  Filed Vitals:   01/13/16 0918  BP: 134/74  Pulse: 50  Resp: 16    Complications: No apparent anesthesia complications

## 2016-01-13 NOTE — H&P (Signed)
Reviewed paper H+P, will be scanned into chart. No changes noted.  

## 2016-01-13 NOTE — Anesthesia Preprocedure Evaluation (Signed)
Anesthesia Evaluation  Patient identified by MRN, date of birth, ID band Patient awake    Reviewed: Allergy & Precautions, H&P , NPO status , Patient's Chart, lab work & pertinent test results, reviewed documented beta blocker date and time   History of Anesthesia Complications Negative for: history of anesthetic complications  Airway Mallampati: II  TM Distance: >3 FB Neck ROM: full    Dental no notable dental hx. (+) Caps, Teeth Intact   Pulmonary neg shortness of breath, neg sleep apnea, neg COPD, neg recent URI, Current Smoker,    Pulmonary exam normal breath sounds clear to auscultation       Cardiovascular Exercise Tolerance: Good negative cardio ROS Normal cardiovascular exam Rhythm:regular Rate:Normal     Neuro/Psych negative neurological ROS  negative psych ROS   GI/Hepatic Neg liver ROS, GERD  ,  Endo/Other  negative endocrine ROS  Renal/GU negative Renal ROS  negative genitourinary   Musculoskeletal   Abdominal   Peds  Hematology negative hematology ROS (+)   Anesthesia Other Findings Past Medical History:   GERD (gastroesophageal reflux disease)                       Headache                                                     Reproductive/Obstetrics negative OB ROS                             Anesthesia Physical Anesthesia Plan  ASA: I  Anesthesia Plan: General   Post-op Pain Management:    Induction:   Airway Management Planned:   Additional Equipment:   Intra-op Plan:   Post-operative Plan:   Informed Consent: I have reviewed the patients History and Physical, chart, labs and discussed the procedure including the risks, benefits and alternatives for the proposed anesthesia with the patient or authorized representative who has indicated his/her understanding and acceptance.   Dental Advisory Given  Plan Discussed with: Anesthesiologist, CRNA and  Surgeon  Anesthesia Plan Comments:         Anesthesia Quick Evaluation

## 2016-01-13 NOTE — Anesthesia Postprocedure Evaluation (Signed)
Anesthesia Post Note  Patient: Gary Washington  Procedure(s) Performed: Procedure(s) (LRB): EXCISION MASS UPPER EXTREMETIES (Right)  Patient location during evaluation: PACU Anesthesia Type: General Level of consciousness: awake and alert Pain management: pain level controlled Vital Signs Assessment: post-procedure vital signs reviewed and stable Respiratory status: spontaneous breathing, nonlabored ventilation, respiratory function stable and patient connected to nasal cannula oxygen Cardiovascular status: blood pressure returned to baseline and stable Postop Assessment: no signs of nausea or vomiting Anesthetic complications: no    Last Vitals:  Filed Vitals:   01/13/16 1212 01/13/16 1220  BP: 127/81 127/72  Pulse:    Temp: 35.6 C   Resp: 16 16    Last Pain:  Filed Vitals:   01/13/16 1247  PainSc: 6                  Lenard SimmerAndrew Ward Boissonneault

## 2016-01-17 LAB — SURGICAL PATHOLOGY

## 2016-05-31 ENCOUNTER — Encounter (HOSPITAL_COMMUNITY): Payer: Self-pay | Admitting: Emergency Medicine

## 2016-05-31 ENCOUNTER — Emergency Department (HOSPITAL_COMMUNITY): Payer: 59

## 2016-05-31 ENCOUNTER — Emergency Department (HOSPITAL_COMMUNITY)
Admission: EM | Admit: 2016-05-31 | Discharge: 2016-05-31 | Discharge status: Against Medical Advice | Payer: 59 | Attending: Emergency Medicine | Admitting: Emergency Medicine

## 2016-05-31 DIAGNOSIS — Y929 Unspecified place or not applicable: Secondary | ICD-10-CM | POA: Insufficient documentation

## 2016-05-31 DIAGNOSIS — W1830XA Fall on same level, unspecified, initial encounter: Secondary | ICD-10-CM | POA: Insufficient documentation

## 2016-05-31 DIAGNOSIS — Y9351 Activity, roller skating (inline) and skateboarding: Secondary | ICD-10-CM | POA: Insufficient documentation

## 2016-05-31 DIAGNOSIS — S8252XA Displaced fracture of medial malleolus of left tibia, initial encounter for closed fracture: Secondary | ICD-10-CM | POA: Diagnosis not present

## 2016-05-31 DIAGNOSIS — F17210 Nicotine dependence, cigarettes, uncomplicated: Secondary | ICD-10-CM | POA: Diagnosis not present

## 2016-05-31 DIAGNOSIS — S99912A Unspecified injury of left ankle, initial encounter: Secondary | ICD-10-CM | POA: Diagnosis present

## 2016-05-31 DIAGNOSIS — Y999 Unspecified external cause status: Secondary | ICD-10-CM | POA: Insufficient documentation

## 2016-05-31 DIAGNOSIS — S82891A Other fracture of right lower leg, initial encounter for closed fracture: Secondary | ICD-10-CM

## 2016-05-31 MED ORDER — DICLOFENAC SODIUM 50 MG PO TBEC: 50.0000 mg | DELAYED_RELEASE_TABLET | Freq: Two times a day (BID) | ORAL | 0 refills | Status: DC

## 2016-05-31 MED ORDER — SODIUM CHLORIDE 0.9 % IV SOLN
INTRAVENOUS | Status: DC
  Administered 2016-05-31: 19:00:00 via INTRAVENOUS

## 2016-05-31 MED ORDER — KETOROLAC TROMETHAMINE 60 MG/2ML IM SOLN
30.0000 mg | Freq: Once | INTRAMUSCULAR | Status: DC
  Filled 2016-05-31: qty 2

## 2016-05-31 MED ORDER — ONDANSETRON 4 MG PO TBDP
4.0000 mg | ORAL_TABLET | Freq: Once | ORAL | Status: AC
  Administered 2016-05-31: 4 mg via ORAL
  Filled 2016-05-31: qty 1

## 2016-05-31 MED ORDER — OXYCODONE-ACETAMINOPHEN 5-325 MG PO TABS
1.0000 | ORAL_TABLET | Freq: Once | ORAL | Status: AC
  Administered 2016-05-31: 1 via ORAL
  Filled 2016-05-31: qty 1

## 2016-05-31 MED ORDER — OXYCODONE-ACETAMINOPHEN 5-325 MG PO TABS: 1.0000 | ORAL_TABLET | Freq: Four times a day (QID) | ORAL | 0 refills | Status: DC | PRN

## 2016-05-31 MED ORDER — HYDROMORPHONE HCL 1 MG/ML IJ SOLN
1.0000 mg | Freq: Once | INTRAMUSCULAR | Status: DC
  Filled 2016-05-31: qty 1

## 2016-05-31 NOTE — ED Triage Notes (Signed)
Ankle injury while skateboarding. Left ankle swollen and painful. Able to move toes, dp pulse 2+.

## 2016-05-31 NOTE — ED Notes (Signed)
Ortho at bedside at this time.

## 2016-05-31 NOTE — ED Provider Notes (Signed)
[facility]-EMERGENCY DEPT Provider Note   CSN: [id number] Arrival date & time: 05/31/16  1810  By signing my name below, I, Linna DarnerRussell Turner, attest that this documentation has been prepared under the direction and in the presence of Kerrie BuffaloHope Niala Stcharles, NP. Electronically Signed: Linna Darnerussell Turner, Scribe. 05/31/2016. 6:42 PM.  History   Chief Complaint Chief Complaint  Patient presents with  . Ankle Injury    The history is provided by the patient. No language interpreter was used.  Ankle Injury  This is a new problem. The current episode started less than 1 hour ago. The problem occurs constantly. The problem has not changed since onset.Pertinent negatives include no chest pain, no abdominal pain, no headaches and no shortness of breath.     HPI Comments: Brantley StageBrandon D Chenier is a 20 y.o. male who presents to the Emergency Department complaining of sudden onset, constant, severe, 11/10, left ankle pain and swelling beginning about 20 minutes ago. Pt reports he was skateboarding and his left foot became stuck which caused his left ankle to "snap". He states he fell on a concrete surface but denies hitting his head or losing consciousness. Pt notes nausea as well. He denies any other complaints.  Past Medical History:  Diagnosis Date  . GERD (gastroesophageal reflux disease)   . Headache     Patient Active Problem List   Diagnosis Date Noted  . Suicidal ideation 03/04/2014    Past Surgical History:  Procedure Laterality Date  . EXCISION MASS UPPER EXTREMETIES Right 01/13/2016   Procedure: EXCISION MASS UPPER EXTREMETIES;  Surgeon: Kennedy BuckerMichael Menz, MD;  Location: [facility] ORS;  Service: Orthopedics;  Laterality: Right;       Home Medications    Prior to Admission medications   Medication Sig Start Date End Date Taking? Authorizing Provider  diclofenac (VOLTAREN) 50 MG EC tablet Take 1 tablet (50 mg total) by mouth 2 (two) times daily. 05/31/16   Shelley Pooley Orlene OchM Adeliz Tonkinson, NP  HYDROcodone-acetaminophen (NORCO) 5-325  MG tablet Take 1 tablet by mouth every 6 (six) hours as needed for moderate pain. 01/13/16   Kennedy BuckerMichael Menz, MD  oxyCODONE-acetaminophen (ROXICET) 5-325 MG tablet Take 1 tablet by mouth every 6 (six) hours as needed for severe pain. 05/31/16   Tallis Soledad Orlene OchM Aanchal Cope, NP    Family History History reviewed. No pertinent family history.  Social History Social History  Substance Use Topics  . Smoking status: Current Every Day Smoker    Packs/day: 0.25    Years: 2.00    Types: Cigarettes  . Smokeless tobacco: Never Used  . Alcohol use No     Allergies   Review of patient's allergies indicates no known allergies.   Review of Systems Review of Systems  Respiratory: Negative for shortness of breath.   Cardiovascular: Negative for chest pain.  Gastrointestinal: Positive for nausea. Negative for abdominal pain.  Musculoskeletal: Positive for arthralgias (left ankle) and joint swelling (left ankle).  Neurological: Negative for syncope and headaches.    Physical Exam Updated Vital Signs BP 131/83 (BP Location: Left Arm)   Pulse 64   Temp 98.1 F (36.7 C) (Oral)   Ht 5\' 11"  (1.803 m)   Wt 68 kg   SpO2 99%   BMI 20.92 kg/m   Physical Exam  Constitutional: He is oriented to person, place, and time. He appears well-developed and well-nourished. No distress.  Pale, diaphoretic, appears to be in severe pain.  HENT:  Head: Normocephalic and atraumatic.  Eyes: Conjunctivae and EOM are normal.  Neck: Neck  supple. No tracheal deviation present.  Cardiovascular: Normal rate.   Pulmonary/Chest: Effort normal. No respiratory distress.  Musculoskeletal:       Left ankle: He exhibits decreased range of motion and swelling. He exhibits no laceration and normal pulse. Tenderness. Lateral malleolus and medial malleolus tenderness found. Achilles tendon normal.  Neurological: He is alert and oriented to person, place, and time.  Skin: Skin is warm and dry.  Psychiatric: He has a normal mood and affect.    Nursing note and vitals reviewed.   ED Treatments / Results  Labs  Xrays reviewed with Dr. Jeraldine Loots and he examined the patient.  Consult with Dr. Dion Saucier and he will see the patient for follow up in the office 06/07/2016.  Splint, pain management  Labs Reviewed - No data to display  Radiology Dg Ankle Complete Left  Result Date: 05/31/2016 CLINICAL DATA:  Pain and swelling of the left ankle after fall while skateboarding tonight. EXAM: LEFT ANKLE COMPLETE - 3+ VIEW COMPARISON:  None. FINDINGS: Soft tissue swelling about the medial on lateral left ankle. Acute oblique fracture of the distal left fibula with minimal displacement and normal alignment of the distal fracture fragment. Acute appearing ossicle inferior to the medial malleolus consistent with acute avulsion fracture. Small avulsion fracture also demonstrated off of the anterior and posterior tibial malleoli. Minimal displacement of fracture fragments. No focal bone lesions. No radiopaque soft tissue foreign bodies. IMPRESSION: Minimally displaced acute fractures demonstrated in the left distal fibula as well as avulsion fractures at the anterior, posterior, and medial malleolus of the distal tibia. Diffuse soft tissue swelling over the ankle. Electronically Signed   By: Burman Nieves M.D.   On: 05/31/2016 18:53    Procedures Procedures (including critical care time)  DIAGNOSTIC STUDIES: Oxygen Saturation is 99% on room air normal by my interpretation.    COORDINATION OF CARE: 6:42 PM Discussed treatment plan with pt at bedside and pt agreed to plan.  6:51 PM Patient is pale, diaphoretic, and appears to be in severe pain. Will start an IV and give medication for nausea.  8:25 PM After splint applied, patient complained of increased pain after movement and procedure to apply the splint. He is requesting additional pain medication. Patient removed his IV. Will give 30 mg Toradol and 1 mg Dilaudid IM.  Medications Ordered in  ED Medications  0.9 %  sodium chloride infusion ( Intravenous New Bag/Given 05/31/16 1910)  ketorolac (TORADOL) injection 30 mg (not administered)  HYDROmorphone (DILAUDID) injection 1 mg (not administered)  oxyCODONE-acetaminophen (PERCOCET/ROXICET) 5-325 MG per tablet 1 tablet (1 tablet Oral Given 05/31/16 1835)  ondansetron (ZOFRAN-ODT) disintegrating tablet 4 mg (4 mg Oral Given 05/31/16 1910)     Initial Impression / Assessment and Plan / ED Course  I have reviewed the triage vital signs and the nursing notes.  Pertinent imaging results that were available during my care of the patient were reviewed by me and considered in my medical decision making (see chart for details).  Clinical Course  Value Comment By Time  DG Ankle Complete Left (Reviewed) Linna Darner 08/30 1852    I personally performed the services described in this documentation, which was scribed in my presence. The recorded information has been reviewed and is accurate.   Final Clinical Impressions(s) / ED Diagnoses  20 y.o. male with left ankle pain and swelling stable for d/c without focal neuro deficits. Discussed with the patient clinical and x-ray findings and plan of care. All questioned fully answered.  He will f/u with ortho or return if any problems arise. Discussed signs ad symptoms that would indicate need for return.   Final diagnoses:  Ankle fracture, right, closed, initial encounter    New Prescriptions New Prescriptions   DICLOFENAC (VOLTAREN) 50 MG EC TABLET    Take 1 tablet (50 mg total) by mouth 2 (two) times daily.   OXYCODONE-ACETAMINOPHEN (ROXICET) 5-325 MG TABLET    Take 1 tablet by mouth every 6 (six) hours as needed for severe pain.     [address] [place], [place] 05/31/16 1610    Gerhard Munch, MD 05/31/16 [phone]

## 2016-05-31 NOTE — ED Notes (Signed)
Patient left AMA on crutches. Had come out of his room & stated that he couldn't wait any longer. Patient had stated that father was coming to pick him up, but then reported that he would call [employer] for a ride to his parents' home. Instructed patient to return to his room for pain medication. Understanding and agreement verbalized. RN went to prepare medications, but patient had left without being discharged & without discharge papers/prescriptions. RN & EMT looked for patient outside of hospital. Unable to find. Security notified. Attempted to call patient on number listed in chart. After several rings, call went to voicemail that has not been set up - unable to leave voicemail.

## 2016-05-31 NOTE — Discharge Instructions (Signed)
Dg Ankle Complete Left  Result Date: 05/31/2016 CLINICAL DATA:  Pain and swelling of the left ankle after fall while skateboarding tonight. EXAM: LEFT ANKLE COMPLETE - 3+ VIEW COMPARISON:  None. FINDINGS: Soft tissue swelling about the medial on lateral left ankle. Acute oblique fracture of the distal left fibula with minimal displacement and normal alignment of the distal fracture fragment. Acute appearing ossicle inferior to the medial malleolus consistent with acute avulsion fracture. Small avulsion fracture also demonstrated off of the anterior and posterior tibial malleoli. Minimal displacement of fracture fragments. No focal bone lesions. No radiopaque soft tissue foreign bodies. IMPRESSION: Minimally displaced acute fractures demonstrated in the left distal fibula as well as avulsion fractures at the anterior, posterior, and medial malleolus of the distal tibia. Diffuse soft tissue swelling over the ankle. Electronically Signed   By: Burman NievesWilliam  Stevens M.D.   On: 05/31/2016 18:53     Call Dr. Shelba FlakeLandau's office tomorrow to schedule follow up. Return here sooner for any problems.

## 2016-05-31 NOTE — Progress Notes (Signed)
Orthopedic Tech Progress Note Patient Details:  Brantley StageBrandon D Pardon 1996-04-27 [id number]  Ortho Devices Type of Ortho Device: Ace wrap, Crutches, Post (short leg) splint Ortho Device/Splint Location: LLE Ortho Device/Splint Interventions: Ordered, Application   Jennye MoccasinHughes, Kairo Laubacher Craig 05/31/2016, 8:10 PM

## 2016-05-31 NOTE — ED Notes (Signed)
Patient left AMA on crutches.

## 2016-05-31 NOTE — ED Notes (Signed)
Patient transported to X-ray 

## 2016-06-01 ENCOUNTER — Emergency Department
Admission: EM | Admit: 2016-06-01 | Discharge: 2016-06-01 | Disposition: A | Discharge status: Home / Self Care | Payer: 59 | Attending: Emergency Medicine | Admitting: Emergency Medicine

## 2016-06-01 ENCOUNTER — Encounter: Payer: Self-pay | Admitting: Medical Oncology

## 2016-06-01 DIAGNOSIS — S82392D Other fracture of lower end of left tibia, subsequent encounter for closed fracture with routine healing: Secondary | ICD-10-CM | POA: Insufficient documentation

## 2016-06-01 DIAGNOSIS — S82832D Other fracture of upper and lower end of left fibula, subsequent encounter for closed fracture with routine healing: Secondary | ICD-10-CM | POA: Insufficient documentation

## 2016-06-01 DIAGNOSIS — F17210 Nicotine dependence, cigarettes, uncomplicated: Secondary | ICD-10-CM | POA: Diagnosis not present

## 2016-06-01 DIAGNOSIS — X58XXXD Exposure to other specified factors, subsequent encounter: Secondary | ICD-10-CM | POA: Diagnosis not present

## 2016-06-01 DIAGNOSIS — S82892D Other fracture of left lower leg, subsequent encounter for closed fracture with routine healing: Secondary | ICD-10-CM

## 2016-06-01 DIAGNOSIS — S99912D Unspecified injury of left ankle, subsequent encounter: Secondary | ICD-10-CM | POA: Diagnosis present

## 2016-06-01 MED ORDER — IBUPROFEN 800 MG PO TABS: 800.0000 mg | ORAL_TABLET | Freq: Three times a day (TID) | ORAL | 0 refills | Status: AC | PRN

## 2016-06-01 MED ORDER — OXYCODONE-ACETAMINOPHEN 5-325 MG PO TABS: 1.0000 | ORAL_TABLET | Freq: Four times a day (QID) | ORAL | 0 refills | Status: AC | PRN

## 2016-06-01 MED ORDER — OXYCODONE-ACETAMINOPHEN 5-325 MG PO TABS
1.0000 | ORAL_TABLET | Freq: Once | ORAL | Status: AC
  Administered 2016-06-01: 1 via ORAL
  Filled 2016-06-01: qty 1

## 2016-06-01 NOTE — ED Triage Notes (Signed)
[facility] for [facility] injury while skateboarding. C/o pain and did not receive pain med rx.

## 2016-06-01 NOTE — Discharge Instructions (Signed)
Take pain medicine as directed. Contact the orthopedic department tomorrow to set up a follow-up appointment.

## 2016-06-01 NOTE — ED Provider Notes (Signed)
[facility] Emergency Department Provider Note  ____________________________________________  Time seen: Approximately 7:19 PM  I have reviewed the triage vital signs and the nursing notes.   HISTORY  Chief Complaint Leg Pain    HPI Gary Washington is a 20 y.o. male who fractured his left ankle yesterday skateboarding and was seen in the emergency room at [facility]. He apparently left before receiving his discharge instructions or pain medicines. He reports that the splint became too tight and he unwrapped it relieving the pressure and applying ice. He presents today to have his leg rewrapped. He denies any other new symptoms.   Past Medical History:  Diagnosis Date  . GERD (gastroesophageal reflux disease)   . Headache     Patient Active Problem List   Diagnosis Date Noted  . Suicidal ideation 03/04/2014    Past Surgical History:  Procedure Laterality Date  . EXCISION MASS UPPER EXTREMETIES Right 01/13/2016   Procedure: EXCISION MASS UPPER EXTREMETIES;  Surgeon: Kennedy BuckerMichael Menz, MD;  Location: [facility] ORS;  Service: Orthopedics;  Laterality: Right;    Current Outpatient Rx  . Order #: [id number] Class: Print  . Order #: [id number] Class: Print    Allergies Review of patient's allergies indicates no known allergies.  No family history on file.  Social History Social History  Substance Use Topics  . Smoking status: Current Every Day Smoker    Packs/day: 0.25    Years: 2.00    Types: Cigarettes  . Smokeless tobacco: Never Used  . Alcohol use No    Review of Systems Constitutional: No fever/chills Eyes: No visual changes. Cardiovascular: Denies chest pain. Respiratory: Denies shortness of breath. Skin: Negative for rash.  10-point ROS otherwise negative.  ____________________________________________   PHYSICAL EXAM:  VITAL SIGNS: ED Triage Vitals  Enc Vitals Group     BP 06/01/16 1807 (!) 142/64     Pulse Rate 06/01/16 1807 80   Resp 06/01/16 1807 18     Temp 06/01/16 1807 98 F (36.7 C)     Temp Source 06/01/16 1807 Oral     SpO2 06/01/16 1807 100 %     Weight 06/01/16 1805 150 lb (68 kg)     Height 06/01/16 1805 5\' 11"  (1.803 m)     Head Circumference --      Peak Flow --      Pain Score 06/01/16 1805 10     Pain Loc --      Pain Edu? --      Excl. in GC? --     Constitutional: Alert and oriented. Well appearing and in no acute distress. Eyes: Conjunctivae are normal. PERRL.  Neck:  Supple.   Cardiovascular: Normal rate, regular rhythm. Grossly normal heart sounds.  Good peripheral circulation. Respiratory: Normal respiratory effort.  No retractions. Lungs CTAB. Musculoskeletal: Left lower extremity in a posterior OCL. Ace wrap removed. Pulses intact. Swelling still visible. Bruising evident. Neurologic:  Normal speech and language. No gross focal neurologic deficits are appreciated.  Skin:  Skin is warm, dry and intact. No rash noted. Psychiatric: Mood and affect are normal. Speech and behavior are normal.  ____________________________________________   LABS (all labs ordered are listed, but only abnormal results are displayed)  Labs Reviewed - No data to display ____________________________________________  EKG   ____________________________________________  RADIOLOGY  EXAM: LEFT ANKLE COMPLETE - 3+ VIEW  COMPARISON:  None.  FINDINGS: Soft tissue swelling about the medial on lateral left ankle. Acute oblique fracture of the distal left fibula  with minimal displacement and normal alignment of the distal fracture fragment. Acute appearing ossicle inferior to the medial malleolus consistent with acute avulsion fracture. Small avulsion fracture also demonstrated off of the anterior and posterior tibial malleoli. Minimal displacement of fracture fragments. No focal bone lesions. No radiopaque soft tissue foreign bodies.  IMPRESSION: Minimally displaced acute fractures demonstrated in  the left distal fibula as well as avulsion fractures at the anterior, posterior, and medial malleolus of the distal tibia. Diffuse soft tissue swelling over the ankle.   Electronically Signed   By: Burman Nieves M.D.   On: 05/31/2016 18:53 ____________________________________________   PROCEDURES  Procedure(s) performed: None  Critical Care performed: No  ____________________________________________   INITIAL IMPRESSION / ASSESSMENT AND PLAN / ED COURSE  Pertinent labs & imaging results that were available during my care of the patient were reviewed by me and considered in my medical decision making (see chart for details).  20 year old male seen yesterday in the emergency room at [facility] for left ankle fracture. He unwrapped the splint because of pain and swelling and returns today to have the leg rewrapped. Also he left the emergency room yesterday before receiving discharge paperwork and prescriptions. He is given ibuprofen and Percocet here in the emergency room. Also can follow-up with a local orthopedist for further evaluation. Encouraged him to call tomorrow to set up follow-up evaluation. ____________________________________________   FINAL CLINICAL IMPRESSION(S) / ED DIAGNOSES  Final diagnoses:  Ankle fracture, left, closed, with routine healing, subsequent encounter      Ignacia Bayley, PA-C 06/01/16 1930    Nita Sickle, MD 06/02/16 1330

## 2016-06-01 NOTE — ED Notes (Signed)
See triage note.. Was seen at [facility] yesterday   dx'd with fx'd ankle  Had OCl splint applied   States the ace wrap became to tight and he loosened to ace wrap and placed ice bag on ankle .  States he needs leg re- wrapped

## 2016-06-13 ENCOUNTER — Other Ambulatory Visit (HOSPITAL_COMMUNITY): Payer: Self-pay | Admitting: Orthopedic Surgery

## 2016-06-13 DIAGNOSIS — S82832A Other fracture of upper and lower end of left fibula, initial encounter for closed fracture: Secondary | ICD-10-CM

## 2016-06-13 DIAGNOSIS — S82892A Other fracture of left lower leg, initial encounter for closed fracture: Secondary | ICD-10-CM

## 2016-06-13 DIAGNOSIS — S93402A Sprain of unspecified ligament of left ankle, initial encounter: Secondary | ICD-10-CM

## 2017-12-25 IMAGING — CR DG ANKLE COMPLETE 3+V*L*
3 series · 3 of 3 positions shown · non-contrast
Comparison: None.

CLINICAL DATA: Pain and swelling of the left ankle after fall while
skateboarding tonight.

EXAM:
LEFT ANKLE COMPLETE - 3+ VIEW

[ankle ap]
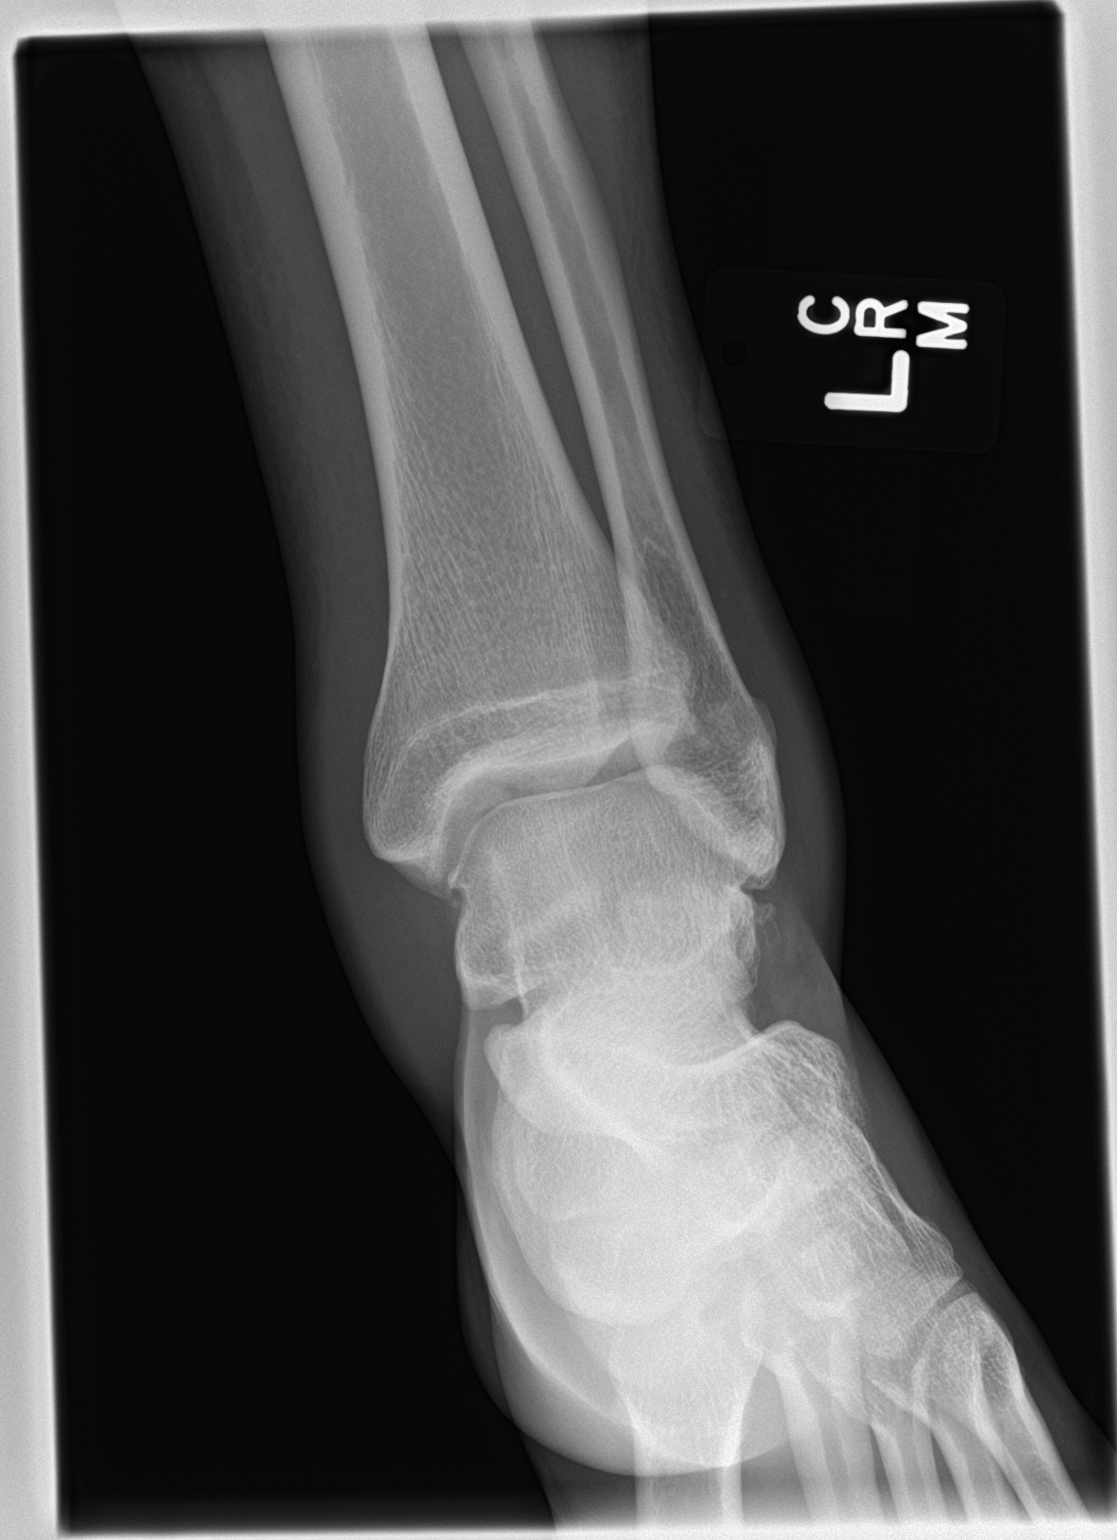

[ankle obl]
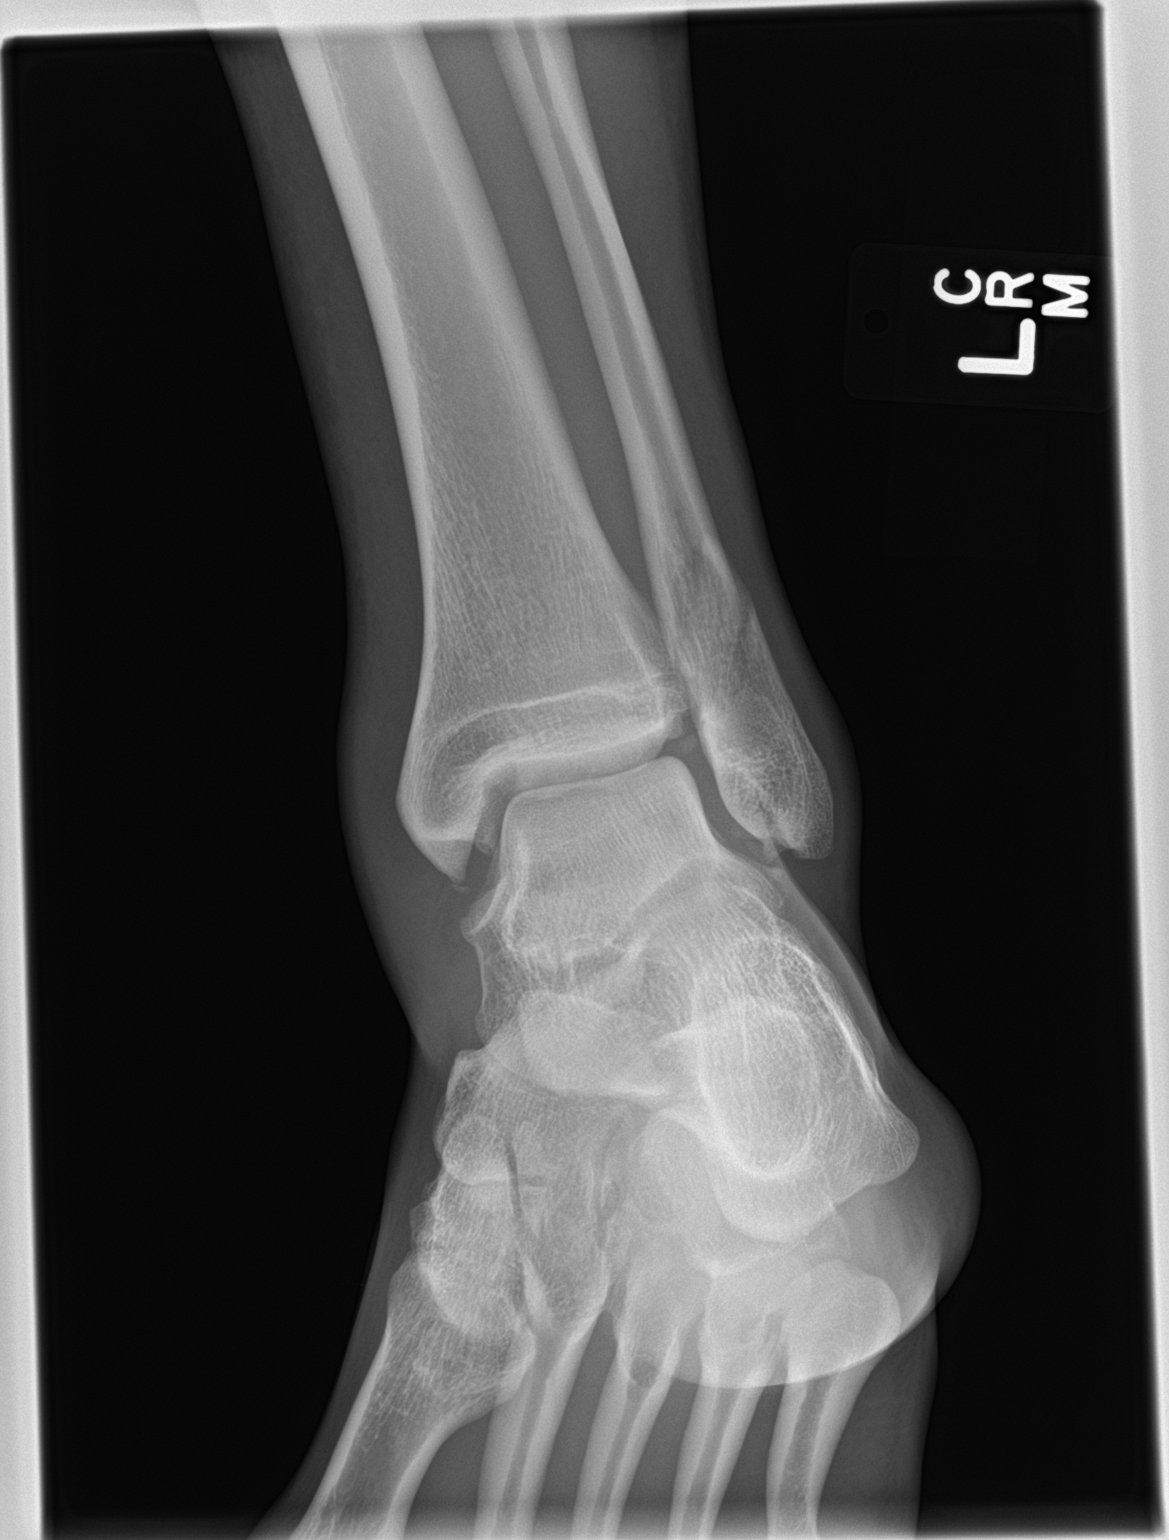

[ankle lat]
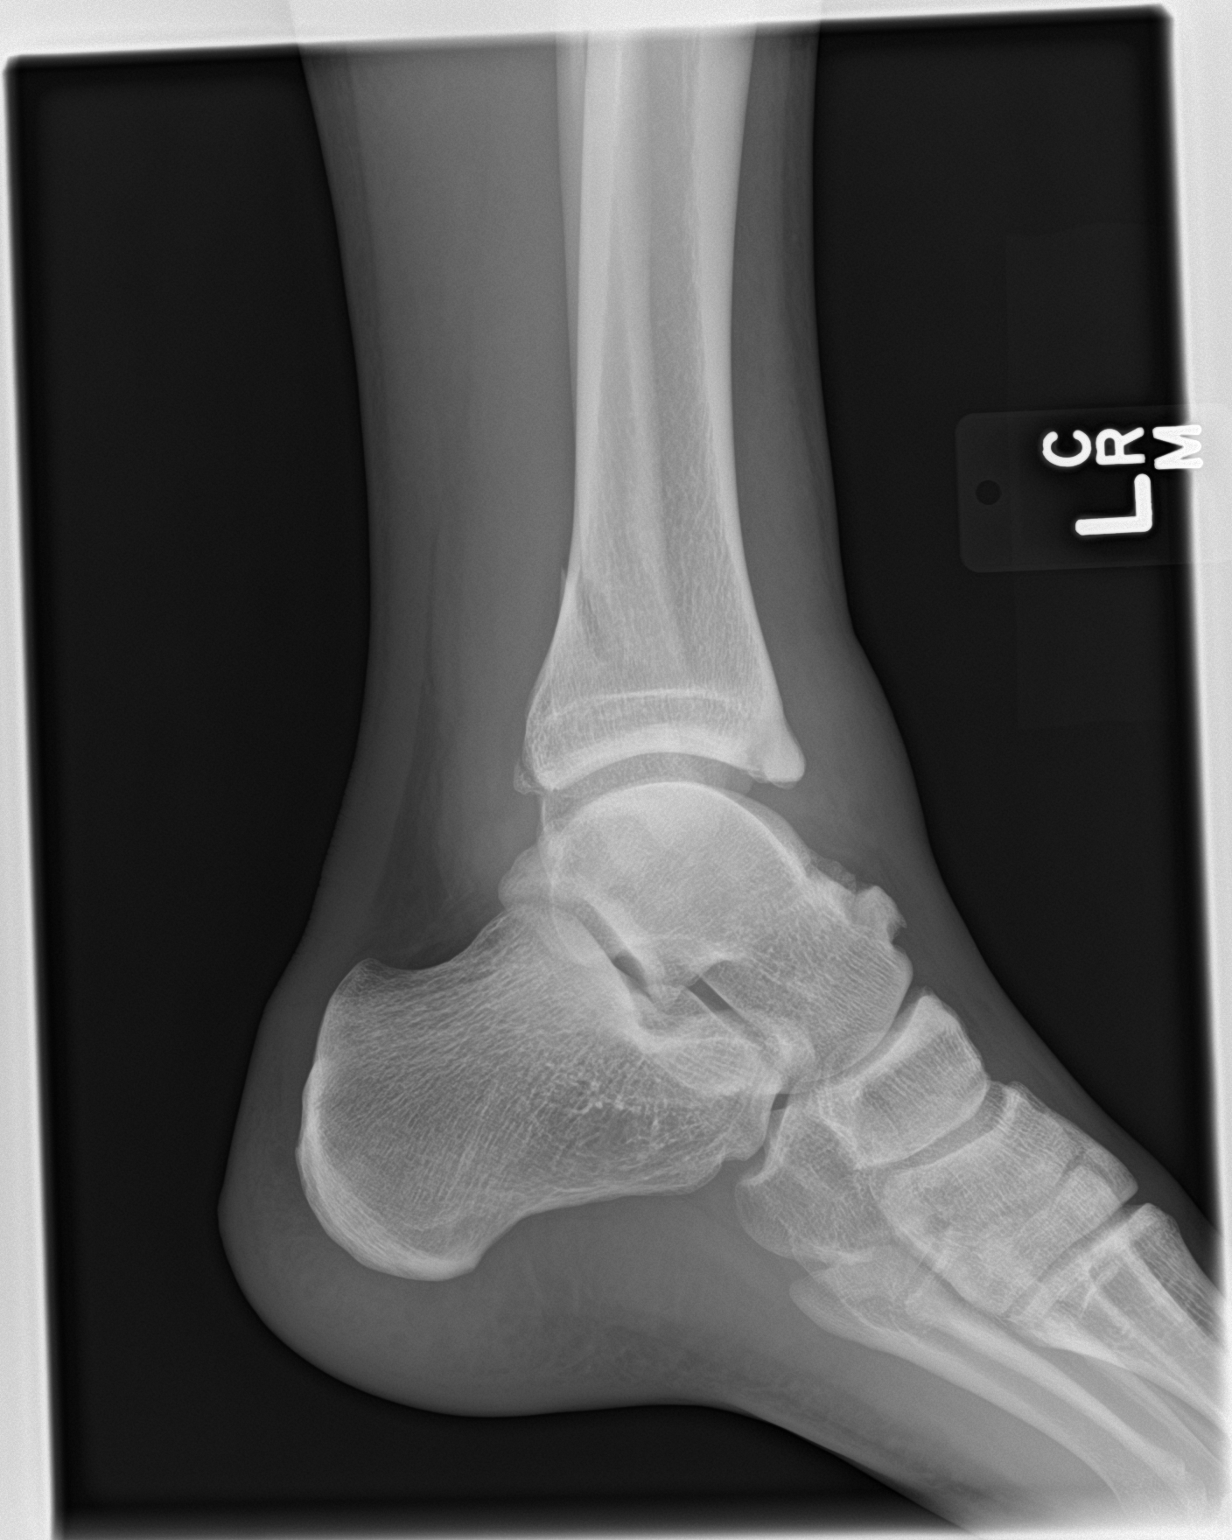

[3 of 3 positions shown; findings below may reference images not displayed]

FINDINGS: Soft tissue swelling about the medial on lateral left ankle. Acute
oblique fracture of the distal left fibula with minimal displacement
and normal alignment of the distal fracture fragment. Acute
appearing ossicle inferior to the medial malleolus consistent with
acute avulsion fracture. Small avulsion fracture also demonstrated
off of the anterior and posterior tibial malleoli. Minimal
displacement of fracture fragments. No focal bone lesions. No
radiopaque soft tissue foreign bodies.
IMPRESSION: Minimally displaced acute fractures demonstrated in the left distal
fibula as well as avulsion fractures at the anterior, posterior, and
medial malleolus of the distal tibia. Diffuse soft tissue swelling
over the ankle.

## 2018-01-18 ENCOUNTER — Other Ambulatory Visit: Payer: Self-pay

## 2018-01-18 ENCOUNTER — Emergency Department
Admission: EM | Admit: 2018-01-18 | Discharge: 2018-01-18 | Disposition: A | Discharge status: Home / Self Care | Payer: 59 | Attending: Emergency Medicine | Admitting: Emergency Medicine

## 2018-01-18 DIAGNOSIS — F17210 Nicotine dependence, cigarettes, uncomplicated: Secondary | ICD-10-CM | POA: Diagnosis not present

## 2018-01-18 DIAGNOSIS — F10920 Alcohol use, unspecified with intoxication, uncomplicated: Secondary | ICD-10-CM | POA: Diagnosis not present

## 2018-01-18 DIAGNOSIS — R112 Nausea with vomiting, unspecified: Secondary | ICD-10-CM | POA: Diagnosis present

## 2018-01-18 MED ORDER — SODIUM CHLORIDE 0.9 % IV BOLUS
1000.0000 mL | Freq: Once | INTRAVENOUS | Status: AC
  Administered 2018-01-18: 1000 mL via INTRAVENOUS

## 2018-01-18 MED ORDER — ONDANSETRON 4 MG PO TBDP: 4.0000 mg | ORAL_TABLET | Freq: Three times a day (TID) | ORAL | 0 refills | Status: AC | PRN

## 2018-01-18 MED ORDER — ONDANSETRON HCL 4 MG/2ML IJ SOLN
4.0000 mg | Freq: Once | INTRAMUSCULAR | Status: AC
  Administered 2018-01-18: 4 mg via INTRAVENOUS

## 2018-01-18 MED ORDER — DEXTROSE-NACL 5-0.45 % IV SOLN
1000.0000 mL | Freq: Once | INTRAVENOUS | Status: AC
  Administered 2018-01-18: 1000 mL via INTRAVENOUS

## 2018-01-18 MED ORDER — FAMOTIDINE 20 MG PO TABS: 20.0000 mg | ORAL_TABLET | Freq: Two times a day (BID) | ORAL | 0 refills | Status: AC

## 2018-01-18 MED ORDER — ONDANSETRON HCL 4 MG/2ML IJ SOLN
INTRAMUSCULAR | Status: AC
  Administered 2018-01-18: 4 mg via INTRAVENOUS
  Filled 2018-01-18: qty 2

## 2018-01-18 NOTE — ED Notes (Signed)
Pt alert and oriented X4, active, cooperative, pt in NAD. RR even and unlabored, color WNL Discharge and followup instructions reviewed. Pt left with Jace, who is driving. Stands and ambulates safely.

## 2018-01-18 NOTE — ED Triage Notes (Signed)
Pt brought in by girlfriend for alcohol intoxication, has had half a gallon of vodka since 2100. Pt does not drink regularly, denies any drug use. Here for persistent vomiting.

## 2018-01-18 NOTE — ED Notes (Signed)
Pt given sips of water, pt tolerated well at this time.

## 2018-01-18 NOTE — ED Notes (Signed)
Per pt, states he was "drinking a lot tonight", pt states he drank "vodka." Pt denies drinking regularly. Pt states he had "emotions" which was the reason for drinking more than normal per pt. When asked what emotions pt had pt states "things I've done." This RN asked pt if he was trying to harm himself, pt states "no, I am not suicidal." Pt states he feels sick and needs to throw it all up. Pt's significant other Chase at bedside and states pt was "taking shots." Chase states pt ate dinner tonight and has "thrown up a lot." Almeta MonasChase also reports pt was "talking a lot when he was drinking, and a lot about fortnite." Pt continuing trying to get up from bed, pt will lay down on side then roll over and state he needs to get up. On arrival to bed pt got up and walked to a counter and attempted to lay down on the counter, pt was redirected to the bed and laid down in bed. Unlabored resp noted.

## 2018-01-18 NOTE — ED Notes (Signed)
Sleeping, easily aroused with verbal or physical stimuli. Denies further needs.

## 2018-01-18 NOTE — ED Provider Notes (Signed)
[facility] Emergency Department Provider Note  ____________________________________________  Time seen: Approximately 5:56 AM  I have reviewed the triage vital signs and the nursing notes.   HISTORY  Chief Complaint Alcohol Intoxication    HPI Gary Washington is a 22 y.o. male with a history of GERD who is brought to the ED tonight complaining of nausea and vomiting after drinking a large amount of vodka. He reported that he was drinking so much because he is trying to deal with some negative emotions he has.  Estimates he drank half a handle of vodka. Denies SI HI or hallucinations. No trauma. Complains of repeated vomiting, unable to tolerate oral intake, no aggravating or alleviating factors.      Past Medical History:  Diagnosis Date  . GERD (gastroesophageal reflux disease)   . Headache      Patient Active Problem List   Diagnosis Date Noted  . Suicidal ideation 03/04/2014     Past Surgical History:  Procedure Laterality Date  . EXCISION MASS UPPER EXTREMETIES Right 01/13/2016   Procedure: EXCISION MASS UPPER EXTREMETIES;  Surgeon: Kennedy Bucker, MD;  Location: [facility] ORS;  Service: Orthopedics;  Laterality: Right;     Prior to Admission medications   Medication Sig Start Date End Date Taking? Authorizing Provider  ibuprofen (ADVIL,MOTRIN) 800 MG tablet Take 1 tablet (800 mg total) by mouth every 8 (eight) hours as needed. 06/01/16   Ignacia Bayley, PA-C     Allergies Patient has no known allergies.   No family history on file.  Social History Social History   Tobacco Use  . Smoking status: Current Every Day Smoker    Packs/day: 0.25    Years: 2.00    Pack years: 0.50    Types: Cigarettes  . Smokeless tobacco: Never Used  Substance Use Topics  . Alcohol use: No  . Drug use: Yes    Types: Marijuana    Review of Systems  Constitutional:   No fever or chills.  ENT:   No sore throat. No rhinorrhea. Cardiovascular:   No chest  pain or syncope. Respiratory:   No dyspnea or cough. Gastrointestinal:   Negative for abdominal pain, positive vomiting Musculoskeletal:   Negative for focal pain or swelling All other systems reviewed and are negative except as documented above in ROS and HPI.  ____________________________________________   PHYSICAL EXAM:  VITAL SIGNS: ED Triage Vitals  Enc Vitals Group     BP 01/18/18 0330 113/71     Pulse Rate 01/18/18 0330 65     Resp 01/18/18 0330 (!) 21     Temp 01/18/18 0330 97.6 F (36.4 C)     Temp Source 01/18/18 0330 Oral     SpO2 01/18/18 0330 96 %     Weight 01/18/18 0325 155 lb (70.3 kg)     Height 01/18/18 0325 5\' 11"  (1.803 m)     Head Circumference --      Peak Flow --      Pain Score --      Pain Loc --      Pain Edu? --      Excl. in GC? --     Vital signs reviewed, nursing assessments reviewed.   Constitutional:   Alert and oriented. Well appearing and in no distress. Eyes:   Conjunctivae are normal. EOMI. PERRL. ENT      Head:   Normocephalic and atraumatic.      Nose:   No congestion/rhinnorhea.  Mouth/Throat:   MMM, no pharyngeal erythema. No peritonsillar mass.       Neck:   No meningismus. Full ROM. Hematological/Lymphatic/Immunilogical:   No cervical lymphadenopathy. Cardiovascular:   RRR. Symmetric bilateral radial and DP pulses.  No murmurs.  Respiratory:   Normal respiratory effort without tachypnea/retractions. Breath sounds are clear and equal bilaterally. No wheezes/rales/rhonchi. Gastrointestinal:   Soft and nontender. Non distended. There is no CVA tenderness.  No rebound, rigidity, or guarding.  Musculoskeletal:   Normal range of motion in all extremities. No joint effusions.  No lower extremity tenderness.  No edema. Neurologic:   slurred speech, normal language.  Motor grossly intact. No acute focal neurologic deficits are appreciated.  Skin:    Skin is warm, dry and intact. No rash noted.  No petechiae, purpura, or  bullae.  ____________________________________________    LABS (pertinent positives/negatives) (all labs ordered are listed, but only abnormal results are displayed) Labs Reviewed - No data to display ____________________________________________   EKG    ____________________________________________    RADIOLOGY  No results found.  ____________________________________________   PROCEDURES Procedures  ____________________________________________    CLINICAL IMPRESSION / ASSESSMENT AND PLAN / ED COURSE  Pertinent labs & imaging results that were available during my care of the patient were reviewed by me and considered in my medical decision making (see chart for details).    patient well-appearing no acute distress, presents with alcohol intoxication. Vital signs are unremarkable, exam is unremarkable except for some evidence of intoxication. Due to the volume of liquor that he consumed he is likely going to be pretty dehydrated so give him IV fluids including somewhat dextrose to hopefully stave off the worst symptoms of hangover. In addition to hydration, I'll give him IV Zofran to control nausea hopefully to allow him to tolerate oral intake.   ----------------------------------------- 7:35 AM on 01/18/2018 -----------------------------------------  Improved. Tolerating oral intake.      ____________________________________________   FINAL CLINICAL IMPRESSION(S) / ED DIAGNOSES    Final diagnoses:  Alcoholic intoxication without complication [facility](HCC)     ED Discharge Orders    None      Portions of this note were generated with dragon dictation software. Dictation errors may occur despite best attempts at proofreading.    Sharman CheekStafford, Amily Depp, MD 01/18/18 [phone]

## 2018-01-18 NOTE — ED Notes (Signed)
Pt continuing to sleep at this time, pt lying on left side with equal and unlabored resp noted.

## 2022-05-28 ENCOUNTER — Emergency Department
Admission: EM | Admit: 2022-05-28 | Discharge: 2022-05-28 | Discharge status: Against Medical Advice | Payer: 59 | Attending: Emergency Medicine | Admitting: Emergency Medicine

## 2022-05-28 ENCOUNTER — Other Ambulatory Visit: Payer: Self-pay

## 2022-05-28 DIAGNOSIS — Y909 Presence of alcohol in blood, level not specified: Secondary | ICD-10-CM | POA: Insufficient documentation

## 2022-05-28 DIAGNOSIS — Z5321 Procedure and treatment not carried out due to patient leaving prior to being seen by health care provider: Secondary | ICD-10-CM | POA: Insufficient documentation

## 2022-05-28 DIAGNOSIS — F10120 Alcohol abuse with intoxication, uncomplicated: Secondary | ICD-10-CM | POA: Insufficient documentation

## 2022-05-28 LAB — COMPREHENSIVE METABOLIC PANEL
ALT: 15 U/L (ref 0–44)
AST: 25 U/L (ref 15–41)
Albumin: 5.1 g/dL — ABNORMAL HIGH (ref 3.5–5.0)
Alkaline Phosphatase: 73 U/L (ref 38–126)
Anion gap: 11 (ref 5–15)
BUN: 10 mg/dL (ref 6–20)
CO2: 22 mmol/L (ref 22–32)
Calcium: 9.5 mg/dL (ref 8.9–10.3)
Chloride: 108 mmol/L (ref 98–111)
Creatinine, Ser: 0.97 mg/dL (ref 0.61–1.24)
GFR, Estimated: >60 mL/min (ref >60)
Glucose, Bld: 88 mg/dL (ref 70–99)
Potassium: 3.8 mmol/L (ref 3.5–5.1)
Sodium: 141 mmol/L (ref 135–145)
Total Bilirubin: 1.1 mg/dL (ref 0.3–1.2)
Total Protein: 8.1 g/dL (ref 6.5–8.1)

## 2022-05-28 LAB — CBC
HCT: 46.1 % (ref 39.0–52.0)
Hemoglobin: 15.3 g/dL (ref 13.0–17.0)
MCH: 28.9 pg (ref 26.0–34.0)
MCHC: 33.2 g/dL (ref 30.0–36.0)
MCV: 87.1 fL (ref 80.0–100.0)
Platelets: 309 10*3/uL (ref 150–400)
RBC: 5.29 MIL/uL (ref 4.22–5.81)
RDW: 12.2 % (ref 11.5–15.5)
WBC: 17.3 10*3/uL — ABNORMAL HIGH (ref 4.0–10.5)
nRBC: 0 % (ref 0.0–0.2)

## 2022-05-28 LAB — LIPASE, BLOOD: Lipase: 25 U/L (ref 11–51)

## 2022-05-28 MED ORDER — LACTATED RINGERS IV BOLUS
1000.0000 mL | Freq: Once | INTRAVENOUS | Status: AC
  Administered 2022-05-28: 1000 mL via INTRAVENOUS

## 2022-05-28 MED ORDER — ONDANSETRON HCL 4 MG/2ML IJ SOLN
4.0000 mg | Freq: Once | INTRAMUSCULAR | Status: AC | PRN
  Administered 2022-05-28: 4 mg via INTRAVENOUS
  Filled 2022-05-28: qty 2

## 2022-05-28 NOTE — ED Notes (Signed)
Observed patietn walking out of lobby, stopped him to inquiry him about his IV being removed, pt said it was out and kept walking, pt was asked by McKenzie RN to show her the IV out and  he refused and kept walking with male friend.  Pt continued walking towards parking lot and despite staff informing him that the police would be involved. Pt continued to walk then made a comment about staff removing IV. Pt then voluntarily walked back into the ED and McKenzie RN removed IV.

## 2022-05-28 NOTE — ED Notes (Signed)
D/w Dr. Katrinka Blazing, VO for IVF-LR bolus and labs.

## 2022-05-28 NOTE — ED Triage Notes (Signed)
Pt reports concern for alcohol poisoning after ingesting 8 beers at home on an empty stomach.  Reports n/v at home. Pt alert and following commands. Denies chest pain. Reports dizziness and chills. Pt attempting to lay on floor in triage. Pt breathing unlabored speaking in full sentences. Symmetric chest rise and fall.
